# Patient Record
Sex: Male | Born: 1945
Health system: Southern US, Community
[De-identification: ages and names within clinical notes are randomized; demographics above are authoritative.]

## PROBLEM LIST (undated history)

## (undated) DIAGNOSIS — D649 Anemia, unspecified: Secondary | ICD-10-CM

## (undated) DIAGNOSIS — C801 Malignant (primary) neoplasm, unspecified: Secondary | ICD-10-CM

## (undated) DIAGNOSIS — E7800 Pure hypercholesterolemia, unspecified: Secondary | ICD-10-CM

## (undated) HISTORY — PX: OTHER SURGICAL HISTORY: SHX169

## (undated) HISTORY — PX: EYE SURGERY: SHX253

## (undated) HISTORY — PX: HERNIA REPAIR: SHX51

## (undated) HISTORY — DX: Anemia, unspecified: D64.9

## (undated) HISTORY — DX: Pure hypercholesterolemia, unspecified: E78.00

---

## 1981-11-30 HISTORY — PX: HERNIA REPAIR: SHX51

## 2012-02-08 LAB — LIPID PANEL
Cholesterol: 175 mg/dL (ref 0–200)
HDL: 42 mg/dL (ref 35–70)
Triglycerides: 111 mg/dL (ref 40–160)

## 2012-02-08 LAB — BASIC METABOLIC PANEL
Potassium: 4.5 mmol/L (ref 3.4–5.3)
Sodium: 142 mmol/L (ref 137–147)

## 2012-02-08 LAB — CBC AND DIFFERENTIAL: Platelets: 189 10*3/uL (ref 150–399)

## 2012-02-08 LAB — HEPATIC FUNCTION PANEL: ALT: 18 U/L (ref 10–40)

## 2012-10-10 ENCOUNTER — Ambulatory Visit: Payer: Self-pay | Admitting: Internal Medicine

## 2013-02-10 ENCOUNTER — Encounter: Payer: Self-pay | Admitting: *Deleted

## 2013-02-17 ENCOUNTER — Telehealth: Payer: Self-pay | Admitting: Internal Medicine

## 2013-02-17 NOTE — Telephone Encounter (Signed)
Yes, I can do a physical at his first appt if it has been more than one year since last.

## 2013-02-17 NOTE — Telephone Encounter (Signed)
Tried call pt voice mail not sent up yet

## 2013-02-17 NOTE — Telephone Encounter (Signed)
Added cpx to visit note

## 2013-02-17 NOTE — Telephone Encounter (Signed)
Left message for pt to call office

## 2013-02-17 NOTE — Telephone Encounter (Signed)
PT SPOUSE CALLED WANTING TO KNOW IF HIS FIRST APPOINTMENT WOULD BE A CPX  THIS IS THE FIRST TIME HE HAS SEEN DR Lorin Picket AT THIS OFFICE

## 2013-02-17 NOTE — Telephone Encounter (Signed)
Patient's wife stated that it has been over a year his last physical was 3.11.13.

## 2013-02-21 ENCOUNTER — Ambulatory Visit (INDEPENDENT_AMBULATORY_CARE_PROVIDER_SITE_OTHER): Payer: Medicare Other | Admitting: Internal Medicine

## 2013-02-21 ENCOUNTER — Encounter: Payer: Self-pay | Admitting: Internal Medicine

## 2013-02-21 VITALS — BP 110/74 | HR 71 | Temp 97.7°F | Ht 69.5 in | Wt 167.0 lb

## 2013-02-21 DIAGNOSIS — Z862 Personal history of diseases of the blood and blood-forming organs and certain disorders involving the immune mechanism: Secondary | ICD-10-CM | POA: Insufficient documentation

## 2013-02-21 DIAGNOSIS — E7800 Pure hypercholesterolemia, unspecified: Secondary | ICD-10-CM

## 2013-02-21 DIAGNOSIS — Z1211 Encounter for screening for malignant neoplasm of colon: Secondary | ICD-10-CM

## 2013-02-21 DIAGNOSIS — Z125 Encounter for screening for malignant neoplasm of prostate: Secondary | ICD-10-CM

## 2013-02-21 DIAGNOSIS — D649 Anemia, unspecified: Secondary | ICD-10-CM

## 2013-02-21 LAB — COMPREHENSIVE METABOLIC PANEL
ALT: 24 U/L (ref 0–53)
AST: 23 U/L (ref 0–37)
BUN: 18 mg/dL (ref 6–23)
Calcium: 9.2 mg/dL (ref 8.4–10.5)
Creatinine, Ser: 1.1 mg/dL (ref 0.4–1.5)
Total Bilirubin: 0.9 mg/dL (ref 0.3–1.2)

## 2013-02-21 LAB — CBC WITH DIFFERENTIAL/PLATELET
Basophils Absolute: 0 10*3/uL (ref 0.0–0.1)
Basophils Relative: 0.8 % (ref 0.0–3.0)
Eosinophils Absolute: 0.3 10*3/uL (ref 0.0–0.7)
HCT: 41.2 % (ref 39.0–52.0)
Hemoglobin: 14.1 g/dL (ref 13.0–17.0)
Lymphs Abs: 1.2 10*3/uL (ref 0.7–4.0)
MCHC: 34.2 g/dL (ref 30.0–36.0)
MCV: 93.4 fl (ref 78.0–100.0)
Monocytes Absolute: 0.5 10*3/uL (ref 0.1–1.0)
Neutro Abs: 2.9 10*3/uL (ref 1.4–7.7)
RBC: 4.41 Mil/uL (ref 4.22–5.81)
RDW: 13 % (ref 11.5–14.6)

## 2013-02-21 LAB — LIPID PANEL
Cholesterol: 188 mg/dL (ref 0–200)
HDL: 39.4 mg/dL (ref >39.00)
LDL Cholesterol: 119 mg/dL — ABNORMAL HIGH (ref 0–99)
Total CHOL/HDL Ratio: 5
Triglycerides: 150 mg/dL — ABNORMAL HIGH (ref 0.0–149.0)

## 2013-02-21 NOTE — Progress Notes (Signed)
  Subjective:    [name], male    DOB: 1946-11-03, 67 y.o.   MRN: [id number]  HPI 67 year old male with past history of anemia and hypercholesterolemia who comes in today to follow up on these issues as well as for a complete physical exam.  He states he is doing well.  Stays active.  Still exercising some, but has not been exercising as much lately.  Plans to do more.  No acid reflux.  No cardiac symptoms with increased activity or exertion.  No acid reflux.  Bowels stable.  Overall feels good.    Past Medical History  Diagnosis Date  . Anemia   . Hypercholesterolemia     Review of Systems Patient denies any headache, lightheadedness or dizziness.  No allergy or sinus symptoms.  No chest pain, tightness or palpitations.  No increased shortness of breath, cough or congestion.  No nausea or vomiting.  No acid reflux.  No abdominal pain or cramping.  No bowel change, such as diarrhea, constipation, BRBPR or melana.  No urine change.        Objective:   Physical Exam Filed Vitals:   02/21/13 0805  BP: 110/74  Pulse: 71  Temp: 97.7 F (58.93 C)   67 year old male in no acute distress.  HEENT:  Nares - clear.  Oropharynx - without lesions. NECK:  Supple.  Nontender.  No audible carotid bruit.  HEART:  Appears to be regular.   LUNGS:  No crackles or wheezing audible.  Respirations even and unlabored.   RADIAL PULSE:  Equal bilaterally.  ABDOMEN:  Soft.  Nontender.  Bowel sounds present and normal.  No audible abdominal bruit.  GU:  Normal descended testicles.  No palpable testicular nodules.   RECTAL:  Could not appreciate any palpable prostate nodules.  Heme negative.   EXTREMITIES:  No increased edema present.  DP pulses palpable and equal bilaterally.          Assessment & Plan:  CARDIOVASCULAR.  Asymptomatic.  Continue risk factor modification.    HEALTH MAINTENANCE.  Physical today.  Check psa.  Declines colonoscopy.  Agreed to IFOB.  Discussed importance  of early detection.  Will notify me when agreeable.

## 2013-02-23 ENCOUNTER — Encounter: Payer: Self-pay | Admitting: *Deleted

## 2013-02-27 ENCOUNTER — Encounter: Payer: Self-pay | Admitting: Internal Medicine

## 2013-02-27 NOTE — Assessment & Plan Note (Signed)
Check cbc.  Off iron supplements.    

## 2013-02-27 NOTE — Assessment & Plan Note (Signed)
Low cholesterol diet and exercise.  Check lipid panel.   

## 2013-03-02 ENCOUNTER — Telehealth: Payer: Self-pay | Admitting: Internal Medicine

## 2013-03-02 NOTE — Telephone Encounter (Signed)
Dropped off envelope for Dr. Lorin Picket @ 10:55 a.m.  Put in file box up front.

## 2013-03-02 NOTE — Telephone Encounter (Signed)
noted 

## 2013-03-03 ENCOUNTER — Other Ambulatory Visit (INDEPENDENT_AMBULATORY_CARE_PROVIDER_SITE_OTHER): Payer: Medicare Other

## 2013-03-03 DIAGNOSIS — Z1211 Encounter for screening for malignant neoplasm of colon: Secondary | ICD-10-CM

## 2013-03-07 ENCOUNTER — Encounter: Payer: Self-pay | Admitting: Internal Medicine

## 2013-05-01 ENCOUNTER — Telehealth: Payer: Self-pay | Admitting: Internal Medicine

## 2013-05-01 NOTE — Telephone Encounter (Signed)
Pt dropped off release for activity form to be filled out.  In box

## 2014-02-28 HISTORY — PX: SKIN CANCER EXCISION: SHX779

## 2014-04-04 ENCOUNTER — Ambulatory Visit (INDEPENDENT_AMBULATORY_CARE_PROVIDER_SITE_OTHER): Payer: Medicare Other | Admitting: Internal Medicine

## 2014-04-04 ENCOUNTER — Encounter: Payer: Self-pay | Admitting: Internal Medicine

## 2014-04-04 VITALS — BP 110/70 | HR 75 | Temp 97.8°F | Ht 70.0 in | Wt 172.5 lb

## 2014-04-04 DIAGNOSIS — D649 Anemia, unspecified: Secondary | ICD-10-CM

## 2014-04-04 DIAGNOSIS — Z125 Encounter for screening for malignant neoplasm of prostate: Secondary | ICD-10-CM

## 2014-04-04 DIAGNOSIS — C4491 Basal cell carcinoma of skin, unspecified: Secondary | ICD-10-CM

## 2014-04-04 DIAGNOSIS — Z1211 Encounter for screening for malignant neoplasm of colon: Secondary | ICD-10-CM

## 2014-04-04 DIAGNOSIS — E7800 Pure hypercholesterolemia, unspecified: Secondary | ICD-10-CM

## 2014-04-04 LAB — COMPREHENSIVE METABOLIC PANEL
ALBUMIN: 4.4 g/dL (ref 3.5–5.2)
ALK PHOS: 35 U/L — AB (ref 39–117)
ALT: 30 U/L (ref 0–53)
AST: 26 U/L (ref 0–37)
BUN: 19 mg/dL (ref 6–23)
CO2: 28 mEq/L (ref 19–32)
Calcium: 9.1 mg/dL (ref 8.4–10.5)
Chloride: 103 mEq/L (ref 96–112)
Creatinine, Ser: 1.2 mg/dL (ref 0.4–1.5)
GFR: 63.75 mL/min (ref >60.00)
Glucose, Bld: 86 mg/dL (ref 70–99)
POTASSIUM: 4.6 meq/L (ref 3.5–5.1)
Sodium: 139 mEq/L (ref 135–145)
Total Bilirubin: 0.8 mg/dL (ref 0.2–1.2)
Total Protein: 6.8 g/dL (ref 6.0–8.3)

## 2014-04-04 LAB — CBC WITH DIFFERENTIAL/PLATELET
BASOS PCT: 0.7 % (ref 0.0–3.0)
Basophils Absolute: 0 10*3/uL (ref 0.0–0.1)
EOS ABS: 0.2 10*3/uL (ref 0.0–0.7)
Eosinophils Relative: 3.4 % (ref 0.0–5.0)
HCT: 42 % (ref 39.0–52.0)
Hemoglobin: 14.4 g/dL (ref 13.0–17.0)
Lymphocytes Relative: 28.5 % (ref 12.0–46.0)
Lymphs Abs: 1.3 10*3/uL (ref 0.7–4.0)
MCHC: 34.2 g/dL (ref 30.0–36.0)
MCV: 94.2 fl (ref 78.0–100.0)
MONO ABS: 0.5 10*3/uL (ref 0.1–1.0)
Monocytes Relative: 10.9 % (ref 3.0–12.0)
NEUTROS PCT: 56.5 % (ref 43.0–77.0)
Neutro Abs: 2.6 10*3/uL (ref 1.4–7.7)
PLATELETS: 190 10*3/uL (ref 150.0–400.0)
RBC: 4.46 Mil/uL (ref 4.22–5.81)
RDW: 13.5 % (ref 11.5–15.5)
WBC: 4.7 10*3/uL (ref 4.0–10.5)

## 2014-04-04 LAB — LIPID PANEL
CHOLESTEROL: 184 mg/dL (ref 0–200)
HDL: 38.3 mg/dL — AB (ref >39.00)
LDL CALC: 108 mg/dL — AB (ref 0–99)
TRIGLYCERIDES: 188 mg/dL — AB (ref 0.0–149.0)
Total CHOL/HDL Ratio: 5
VLDL: 37.6 mg/dL (ref 0.0–40.0)

## 2014-04-04 LAB — PSA, MEDICARE: PSA: 1.11 ng/ml (ref 0.10–4.00)

## 2014-04-04 LAB — TSH: TSH: 3.26 u[IU]/mL (ref 0.35–4.50)

## 2014-04-04 NOTE — Progress Notes (Signed)
  Subjective:    [name], male    DOB: [birth year], 68 y.o.   MRN: [id number]  HPI 68 year old male with past history of anemia and hypercholesterolemia who comes in today to follow up on these issues as well as for a complete physical exam.  He states he is doing well.  Stays active.  Still exercising some, but has not been exercising as much lately.  Plans to do more.  No acid reflux.  No cardiac symptoms with increased activity or exertion.  No acid reflux.  Bowels stable.  Overall feels good.  Had basal cell removed right shoulder.  Performed by Dr Evorn Gong.  Has cataracts.  Followed by Dr Gloriann Loan.     Past Medical History  Diagnosis Date  . Anemia   . Hypercholesterolemia     Review of Systems Patient denies any headache, lightheadedness or dizziness.  No allergy or sinus symptoms.  No chest pain, tightness or palpitations.  No increased shortness of breath, cough or congestion.  No nausea or vomiting.  No acid reflux.  No abdominal pain or cramping.  No bowel change, such as diarrhea, constipation, BRBPR or melana.  No urine change.        Objective:   Physical Exam  Filed Vitals:   04/04/14 0846  BP: 110/70  Pulse: 75  Temp: 97.8 F (54.26 C)   68 year old male in no acute distress.  HEENT:  Nares - clear.  Oropharynx - without lesions. NECK:  Supple.  Nontender.  No audible carotid bruit.  HEART:  Appears to be regular.   LUNGS:  No crackles or wheezing audible.  Respirations even and unlabored.   RADIAL PULSE:  Equal bilaterally.  ABDOMEN:  Soft.  Nontender.  Bowel sounds present and normal.  No audible abdominal bruit.  GU:  Normal descended testicles.  No palpable testicular nodules.   RECTAL:  Could not appreciate any palpable prostate nodules.  Heme negative.   EXTREMITIES:  No increased edema present.  DP pulses palpable and equal bilaterally.          Assessment & Plan:  CARDIOVASCULAR.  Asymptomatic.  Continue risk factor modification.     HEALTH MAINTENANCE.  Physical today.  Check psa.  Declines colonoscopy.  Agreed to IFOB.  Again discussed importance of early detection.  Will notify me when agreeable.   I spent 25 minutes with the patient and more than 500% of the time was spent in consultation regarding the above.

## 2014-04-04 NOTE — Progress Notes (Signed)
Pre visit review using our clinic review tool, if applicable. No additional management support is needed unless otherwise documented below in the visit note. 

## 2014-04-06 ENCOUNTER — Encounter: Payer: Self-pay | Admitting: Internal Medicine

## 2014-04-08 ENCOUNTER — Encounter: Payer: Self-pay | Admitting: Internal Medicine

## 2014-04-08 DIAGNOSIS — C4491 Basal cell carcinoma of skin, unspecified: Secondary | ICD-10-CM | POA: Insufficient documentation

## 2014-04-08 NOTE — Assessment & Plan Note (Signed)
Followed by Dr Dasher.   

## 2014-04-08 NOTE — Assessment & Plan Note (Signed)
Low cholesterol diet and exercise.  Check lipid panel.   

## 2014-04-08 NOTE — Assessment & Plan Note (Signed)
Check cbc.  Off iron supplements.

## 2014-04-11 ENCOUNTER — Telehealth: Payer: Self-pay | Admitting: Internal Medicine

## 2014-04-11 ENCOUNTER — Other Ambulatory Visit (INDEPENDENT_AMBULATORY_CARE_PROVIDER_SITE_OTHER): Payer: Medicare Other

## 2014-04-11 DIAGNOSIS — Z1211 Encounter for screening for malignant neoplasm of colon: Secondary | ICD-10-CM

## 2014-04-11 LAB — FECAL OCCULT BLOOD, IMMUNOCHEMICAL: FECAL OCCULT BLD: NEGATIVE

## 2014-04-11 NOTE — Telephone Encounter (Signed)
Lab results in your folder (immunizations updated & original card mailed back to patient)

## 2014-04-11 NOTE — Telephone Encounter (Signed)
Pt came in to office with lab results from [facility] and immunization record.  Placed in Dr. Bary Leriche box.

## 2014-04-11 NOTE — Telephone Encounter (Signed)
Noted.  To be scanned.  (immune to MMR and varicella.

## 2014-04-12 ENCOUNTER — Encounter: Payer: Self-pay | Admitting: Internal Medicine

## 2014-08-27 ENCOUNTER — Encounter: Payer: Self-pay | Admitting: Internal Medicine

## 2014-09-14 ENCOUNTER — Other Ambulatory Visit: Payer: Self-pay

## 2014-10-21 ENCOUNTER — Emergency Department: Payer: Self-pay | Admitting: Emergency Medicine

## 2014-10-21 LAB — CBC
HCT: 42.2 % (ref 40.0–52.0)
HGB: 14 g/dL (ref 13.0–18.0)
MCH: 31.9 pg (ref 26.0–34.0)
MCHC: 33.2 g/dL (ref 32.0–36.0)
MCV: 96 fL (ref 80–100)
PLATELETS: 183 10*3/uL (ref 150–440)
RBC: 4.38 10*6/uL — AB (ref 4.40–5.90)
RDW: 13.2 % (ref 11.5–14.5)
WBC: 6.4 10*3/uL (ref 3.8–10.6)

## 2014-10-21 LAB — COMPREHENSIVE METABOLIC PANEL
Albumin: 3.9 g/dL (ref 3.4–5.0)
Alkaline Phosphatase: 44 U/L — ABNORMAL LOW
Anion Gap: 5 — ABNORMAL LOW (ref 7–16)
BUN: 15 mg/dL (ref 7–18)
Bilirubin,Total: 0.5 mg/dL (ref 0.2–1.0)
CALCIUM: 8.4 mg/dL — AB (ref 8.5–10.1)
CO2: 30 mmol/L (ref 21–32)
Chloride: 108 mmol/L — ABNORMAL HIGH (ref 98–107)
Creatinine: 1.05 mg/dL (ref 0.60–1.30)
EGFR (Non-African Amer.): >60
Glucose: 104 mg/dL — ABNORMAL HIGH (ref 65–99)
OSMOLALITY: 286 (ref 275–301)
Potassium: 4.4 mmol/L (ref 3.5–5.1)
SGOT(AST): 34 U/L (ref 15–37)
SGPT (ALT): 27 U/L
Sodium: 143 mmol/L (ref 136–145)
TOTAL PROTEIN: 7.1 g/dL (ref 6.4–8.2)

## 2014-10-21 LAB — TROPONIN I

## 2014-10-21 LAB — LIPASE, BLOOD: LIPASE: 129 U/L (ref 73–393)

## 2014-10-21 LAB — MAGNESIUM: Magnesium: 2.2 mg/dL

## 2014-10-22 ENCOUNTER — Encounter: Payer: Self-pay | Admitting: Internal Medicine

## 2014-10-22 ENCOUNTER — Ambulatory Visit (INDEPENDENT_AMBULATORY_CARE_PROVIDER_SITE_OTHER): Payer: Medicare Other | Admitting: Cardiovascular Disease

## 2014-10-22 ENCOUNTER — Encounter: Payer: Self-pay | Admitting: Cardiovascular Disease

## 2014-10-22 VITALS — BP 112/70 | HR 74 | Ht 70.0 in | Wt 171.8 lb

## 2014-10-22 DIAGNOSIS — R0789 Other chest pain: Secondary | ICD-10-CM

## 2014-10-22 DIAGNOSIS — I493 Ventricular premature depolarization: Secondary | ICD-10-CM

## 2014-10-22 NOTE — Patient Instructions (Signed)
Your physician has requested that you have a stress echocardiogram. For further information please visit [email or web address]. Please follow instruction sheet as given. - you can eat a light meal  - wear comfortable cloths and walking shoes  - no perfume or lotion on the skin   Your physician has recommended that you wear a holter monitor. Holter monitors are medical devices that record the heart's electrical activity. Doctors most often use these monitors to diagnose arrhythmias. Arrhythmias are problems with the speed or rhythm of the heartbeat. The monitor is a small, portable device. You can wear one while you do your normal daily activities. This is usually used to diagnose what is causing palpitations/syncope (passing out).   Your physician recommends that you schedule a follow-up appointment in:  As needed

## 2014-10-23 ENCOUNTER — Encounter: Payer: Self-pay | Admitting: Cardiovascular Disease

## 2014-10-23 DIAGNOSIS — R0789 Other chest pain: Secondary | ICD-10-CM | POA: Insufficient documentation

## 2014-10-23 DIAGNOSIS — I493 Ventricular premature depolarization: Secondary | ICD-10-CM | POA: Insufficient documentation

## 2014-10-23 NOTE — Progress Notes (Signed)
Primary care physician: Dr. Einar Pheasant  HPI  This is a 68 year old male who was referred from the emergency room at [facility] for evaluation of chest pain. He has no previous cardiac history no significant chronic medical conditions. He does not take any medications. He is not a smoker and has no family history of coronary artery disease. He has been having intermittent episodes of chest discomfort described as substernal pressure feeling when he is lying down to sleep. This usually does not happen with activities even with intense exercise. He feels some skipping in his heart. There has been no dizziness, syncope or presyncope. He denies any shortness of breath. He has no heartburn. These episodes became more frequent and he went to the emergency room at [facility]. His labs were unremarkable. Chest x-ray showed no acute abnormalities. EKG showed PVCs but otherwise no ischemic changes.  No Known Allergies   No current outpatient prescriptions on file prior to visit.   No current facility-administered medications on file prior to visit.     Past Medical History  Diagnosis Date  . Anemia   . Hypercholesterolemia      Past Surgical History  Procedure Laterality Date  . Hernia repair  1983  . Skin cancer excision  4/15    BCCA-shoulder     Family History  Problem Relation Age of Onset  . Diabetes Mother   . Prostate cancer Neg Hx   . Colon cancer Neg Hx      History   Social History  . Marital Status: Married    Spouse Name: N/A    Number of Children: 2  . Years of Education: N/A   Occupational History  . Not on file.   Social History Main Topics  . Smoking status: Never Smoker   . Smokeless tobacco: Never Used  . Alcohol Use: Yes     Comment: occ  . Drug Use: No  . Sexual Activity: Yes   Other Topics Concern  . Not on file   Social History Narrative     ROS A 10 point review of system was performed. It is negative other than that mentioned in the history of  present illness.   PHYSICAL EXAM   BP 112/70 mmHg  Pulse 74  Ht 5\' 10"  (1.778 m)  Wt 171 lb 12 oz (77.905 kg)  BMI 24.64 kg/m2 Constitutional: He is oriented to person, place, and time. He appears well-developed and well-nourished. No distress.  HENT: No nasal discharge.  Head: Normocephalic and atraumatic.  Eyes: Pupils are equal and round.  No discharge. Neck: Normal range of motion. Neck supple. No JVD present. No thyromegaly present.  Cardiovascular: Normal rate, regular rhythm, normal heart sounds. Exam reveals no gallop and no friction rub. No murmur heard.  Pulmonary/Chest: Effort normal and breath sounds normal. No stridor. No respiratory distress. He has no wheezes. He has no rales. He exhibits no tenderness.  Abdominal: Soft. Bowel sounds are normal. He exhibits no distension. There is no tenderness. There is no rebound and no guarding.  Musculoskeletal: Normal range of motion. He exhibits no edema and no tenderness.  Neurological: He is alert and oriented to person, place, and time. Coordination normal.  Skin: Skin is warm and dry. No rash noted. He is not diaphoretic. No erythema. No pallor.  Psychiatric: He has a normal mood and affect. His behavior is normal. Judgment and thought content normal.       EKG: Normal sinus rhythm with frequent PVCs   ASSESSMENT  AND PLAN

## 2014-10-23 NOTE — Assessment & Plan Note (Signed)
The chest pain is overall atypical and nonexertional. It is possible this might be related to PVCs. I requested a stress echocardiogram to rule out a structural or ischemic etiology.

## 2014-10-23 NOTE — Telephone Encounter (Signed)
FYI from the patient

## 2014-10-23 NOTE — Assessment & Plan Note (Signed)
He does not consume excessive amounts of caffeine. Thyroid function was normal in May. The rest of his labs were unremarkable. This could be responsible for some of his symptoms. I requested a Holter monitor to quantify the amount of PVCs in 24 hours and see if treatment is needed.

## 2014-10-29 ENCOUNTER — Telehealth: Payer: Self-pay | Admitting: Cardiovascular Disease

## 2014-10-29 NOTE — Telephone Encounter (Signed)
Pt wife is calling about patient, he was told he would receive a monitor or a call from [facility], they are just checking on it. Please call patient.

## 2014-10-29 NOTE — Telephone Encounter (Signed)
Spoke with Troy Arnold and she mentioned that patient will be contacted this week for placement.

## 2014-11-05 NOTE — Telephone Encounter (Signed)
Pt wife calling stating she has yet to receive a call from [facility] or Margaretha Sheffield. Please call patient.

## 2014-11-06 NOTE — Telephone Encounter (Signed)
Marina-  Can you touch base with Margare[facility] Sheffield again?

## 2014-11-06 NOTE — Telephone Encounter (Signed)
Troy Arnold and she mentioned they were having fax/printer problems resulting in new machine. They have not received order she is aware that order had been faxed twice but due to issues with fax machine they did not receive. I have faxed order to [facility] pt will be contacted next week for placement due to them moving to new building. I will contact pt to make him aware to see if he would like to go there or have monitor placed here.

## 2014-11-06 NOTE — Telephone Encounter (Signed)
LMOVM to contact office if pt would not like to have monitor placed at [facility] and have placed here we will place but if not he will be contacted by [facility].

## 2014-11-14 ENCOUNTER — Ambulatory Visit (INDEPENDENT_AMBULATORY_CARE_PROVIDER_SITE_OTHER): Payer: Medicare Other | Admitting: *Deleted

## 2014-11-14 DIAGNOSIS — I493 Ventricular premature depolarization: Secondary | ICD-10-CM

## 2014-11-26 ENCOUNTER — Other Ambulatory Visit: Payer: Medicare Other

## 2014-11-27 ENCOUNTER — Other Ambulatory Visit: Payer: Medicare Other

## 2014-11-28 ENCOUNTER — Telehealth: Payer: Self-pay | Admitting: *Deleted

## 2014-11-28 NOTE — Telephone Encounter (Signed)
Informed patient per Dr. Fletcher Anon holter showed  NSR with rare PVCS Not enough to cause problems

## 2014-12-03 ENCOUNTER — Ambulatory Visit (INDEPENDENT_AMBULATORY_CARE_PROVIDER_SITE_OTHER): Payer: Self-pay | Admitting: *Deleted

## 2014-12-03 DIAGNOSIS — I493 Ventricular premature depolarization: Secondary | ICD-10-CM

## 2014-12-10 ENCOUNTER — Telehealth: Payer: Self-pay | Admitting: *Deleted

## 2014-12-10 NOTE — Telephone Encounter (Signed)
Reviewed stress echo instructions with patient  PAtient verbalized understanding

## 2014-12-11 ENCOUNTER — Other Ambulatory Visit (INDEPENDENT_AMBULATORY_CARE_PROVIDER_SITE_OTHER): Payer: Medicare Other

## 2014-12-11 DIAGNOSIS — I493 Ventricular premature depolarization: Secondary | ICD-10-CM

## 2014-12-11 DIAGNOSIS — R0789 Other chest pain: Secondary | ICD-10-CM

## 2014-12-18 ENCOUNTER — Telehealth: Payer: Self-pay | Admitting: Cardiovascular Disease

## 2014-12-18 ENCOUNTER — Encounter: Payer: Self-pay | Admitting: Internal Medicine

## 2014-12-18 NOTE — Telephone Encounter (Signed)
Patient had stress test on 01-12 and would like to know results.  Please call patient.

## 2014-12-18 NOTE — Telephone Encounter (Signed)
See result note.  

## 2015-04-08 ENCOUNTER — Encounter: Payer: Self-pay | Admitting: Internal Medicine

## 2015-04-08 ENCOUNTER — Ambulatory Visit (INDEPENDENT_AMBULATORY_CARE_PROVIDER_SITE_OTHER): Payer: Medicare Other | Admitting: Internal Medicine

## 2015-04-08 VITALS — BP 110/78 | HR 68 | Temp 98.0°F | Ht 70.0 in | Wt 166.4 lb

## 2015-04-08 DIAGNOSIS — Z125 Encounter for screening for malignant neoplasm of prostate: Secondary | ICD-10-CM

## 2015-04-08 DIAGNOSIS — I493 Ventricular premature depolarization: Secondary | ICD-10-CM | POA: Diagnosis not present

## 2015-04-08 DIAGNOSIS — D649 Anemia, unspecified: Secondary | ICD-10-CM | POA: Diagnosis not present

## 2015-04-08 DIAGNOSIS — E7800 Pure hypercholesterolemia, unspecified: Secondary | ICD-10-CM

## 2015-04-08 DIAGNOSIS — Z0000 Encounter for general adult medical examination without abnormal findings: Secondary | ICD-10-CM

## 2015-04-08 DIAGNOSIS — C4491 Basal cell carcinoma of skin, unspecified: Secondary | ICD-10-CM

## 2015-04-08 LAB — CBC WITH DIFFERENTIAL/PLATELET
Basophils Absolute: 0 10*3/uL (ref 0.0–0.1)
Basophils Relative: 0.8 % (ref 0.0–3.0)
EOS PCT: 4.1 % (ref 0.0–5.0)
Eosinophils Absolute: 0.2 10*3/uL (ref 0.0–0.7)
HCT: 42.7 % (ref 39.0–52.0)
Hemoglobin: 14.4 g/dL (ref 13.0–17.0)
Lymphocytes Relative: 25.3 % (ref 12.0–46.0)
Lymphs Abs: 1.1 10*3/uL (ref 0.7–4.0)
MCHC: 33.7 g/dL (ref 30.0–36.0)
MCV: 92.3 fl (ref 78.0–100.0)
MONOS PCT: 9.3 % (ref 3.0–12.0)
Monocytes Absolute: 0.4 10*3/uL (ref 0.1–1.0)
Neutro Abs: 2.7 10*3/uL (ref 1.4–7.7)
Neutrophils Relative %: 60.5 % (ref 43.0–77.0)
PLATELETS: 193 10*3/uL (ref 150.0–400.0)
RBC: 4.62 Mil/uL (ref 4.22–5.81)
RDW: 13.3 % (ref 11.5–15.5)
WBC: 4.4 10*3/uL (ref 4.0–10.5)

## 2015-04-08 LAB — COMPREHENSIVE METABOLIC PANEL
ALT: 16 U/L (ref 0–53)
AST: 16 U/L (ref 0–37)
Albumin: 4.2 g/dL (ref 3.5–5.2)
Alkaline Phosphatase: 37 U/L — ABNORMAL LOW (ref 39–117)
BUN: 15 mg/dL (ref 6–23)
CO2: 32 mEq/L (ref 19–32)
Calcium: 9.4 mg/dL (ref 8.4–10.5)
Chloride: 104 mEq/L (ref 96–112)
Creatinine, Ser: 1.07 mg/dL (ref 0.40–1.50)
GFR: 72.56 mL/min (ref >60.00)
Glucose, Bld: 92 mg/dL (ref 70–99)
POTASSIUM: 5 meq/L (ref 3.5–5.1)
Sodium: 140 mEq/L (ref 135–145)
Total Bilirubin: 0.7 mg/dL (ref 0.2–1.2)
Total Protein: 6.7 g/dL (ref 6.0–8.3)

## 2015-04-08 LAB — LIPID PANEL
CHOLESTEROL: 169 mg/dL (ref 0–200)
HDL: 44.6 mg/dL (ref >39.00)
LDL CALC: 97 mg/dL (ref 0–99)
NonHDL: 124.4
Total CHOL/HDL Ratio: 4
Triglycerides: 136 mg/dL (ref 0.0–149.0)
VLDL: 27.2 mg/dL (ref 0.0–40.0)

## 2015-04-08 LAB — PSA, MEDICARE: PSA: 1.77 ng/mL (ref 0.10–4.00)

## 2015-04-08 LAB — TSH: TSH: 2.37 u[IU]/mL (ref 0.35–4.50)

## 2015-04-08 NOTE — Progress Notes (Signed)
Pre visit review using our clinic review tool, if applicable. No additional management support is needed unless otherwise documented below in the visit note. 

## 2015-04-08 NOTE — Progress Notes (Signed)
[name], male   DOB: October 02, 1946, 69 y.o.   MRN: [id number]   Subjective:    [name] [facility], male    DOB: [birth year], 69 y.o.   MRN: [id number]  HPI  Patient here to follow up on his current medical issues and for his physical exam.  Was evaluated by cardiology 11/2014.  Had negative stress test.  Holter monitor revealed rare PVCs and NSR.  Has had no cardiac symptoms since.  Is very physically active.  No cardiac symptoms with increased activity or exertion.  Breathing stable.  Bowels doing well.  No blood.  Declines colonoscopy.  Discussed cologuard.  Information given.    Past Medical History  Diagnosis Date  . Anemia   . Hypercholesterolemia     Review of Systems  Constitutional: Negative for appetite change and unexpected weight change.  HENT: Negative for congestion and sinus pressure.   Eyes: Negative for pain and visual disturbance.  Respiratory: Negative for cough, chest tightness and shortness of breath.   Cardiovascular: Negative for chest pain, palpitations and leg swelling.  Gastrointestinal: Negative for nausea, abdominal pain, diarrhea and constipation.  Genitourinary: Negative for dysuria and difficulty urinating.  Musculoskeletal: Negative for back pain and joint swelling.  Skin: Negative for color change and rash.  Neurological: Negative for dizziness, light-headedness and headaches.  Hematological: Negative for adenopathy. Does not bruise/bleed easily.  Psychiatric/Behavioral: Negative for dysphoric mood and agitation.       Objective:    Physical Exam  Constitutional: He is oriented to person, place, and time. He appears well-developed and well-nourished. No distress.  HENT:  Head: Normocephalic and atraumatic.  Nose: Nose normal.  Mouth/Throat: Oropharynx is clear and moist. No oropharyngeal exudate.  Eyes: Conjunctivae are normal. Right eye exhibits no discharge. Left eye exhibits no discharge.  Neck: Neck supple. No  thyromegaly present.  Cardiovascular: Normal rate and regular rhythm.   Pulmonary/Chest: Breath sounds normal. No respiratory distress. He has no wheezes.  Abdominal: Soft. Bowel sounds are normal. There is no tenderness.  Genitourinary:  Rectal exam - palpable prostate.  Could not appreciate any palpable prostate nodules.  Heme negative.    Musculoskeletal: He exhibits no edema or tenderness.  Lymphadenopathy:    He has no cervical adenopathy.  Neurological: He is alert and oriented to person, place, and time.  Skin: Skin is warm and dry. No rash noted.  Psychiatric: He has a normal mood and affect. His behavior is normal.    BP 110/78 mmHg  Pulse 68  Temp(Src) 98 F (36.7 C) (Oral)  Ht 5\' 10"  (1.778 m)  Wt 166 lb 6 oz (75.467 kg)  BMI 23.87 kg/m2  SpO2 93% Wt Readings from Last 3 Encounters:  04/08/15 166 lb 6 oz (75.467 kg)  10/22/14 171 lb 12 oz (77.905 kg)  04/04/14 172 lb 8 oz (78.245 kg)     Lab Results  Component Value Date   WBC 4.4 04/08/2015   HGB 14.4 04/08/2015   HCT 42.7 04/08/2015   PLT 193.0 04/08/2015   GLUCOSE 92 04/08/2015   CHOL 169 04/08/2015   TRIG 136.0 04/08/2015   HDL 44.60 04/08/2015   LDLCALC 97 04/08/2015   ALT 16 04/08/2015   AST 16 04/08/2015   NA 140 04/08/2015   K 5.0 04/08/2015   CL 104 04/08/2015   CREATININE 1.07 04/08/2015   BUN 15 04/08/2015   CO2 32 04/08/2015   TSH 2.37 04/08/2015   PSA 1.77 04/08/2015  Assessment & Plan:   Problem List Items Addressed This Visit    Anemia - Primary    Off iron supplements.  Recheck cbc.        Relevant Orders   CBC with Differential/Platelet (Completed)   Basal cell carcinoma    Followed by Dr Evorn Gong.       Health care maintenance    Physical 04/08/15.  Check psa with labs today.  Declines colonoscopy.  Discussed the need for screening today.  Discussed cologuard.  He took the information and will let me know if agrees.        Hypercholesterolemia    Low cholesterol diet  and exercise.  Follow lipid panel.        Relevant Orders   Comprehensive metabolic panel (Completed)   Lipid panel (Completed)   PVC (premature ventricular contraction)    Had holter recently that revealed rare PVC's.  Currently stays active with no symptoms.  Follow.       Relevant Orders   TSH (Completed)    Other Visit Diagnoses    Prostate cancer screening        Relevant Orders    PSA, Medicare (Completed)        Einar Pheasant, MD

## 2015-04-09 ENCOUNTER — Encounter: Payer: Self-pay | Admitting: Internal Medicine

## 2015-04-14 ENCOUNTER — Encounter: Payer: Self-pay | Admitting: Internal Medicine

## 2015-04-14 DIAGNOSIS — Z0000 Encounter for general adult medical examination without abnormal findings: Secondary | ICD-10-CM | POA: Insufficient documentation

## 2015-04-14 NOTE — Assessment & Plan Note (Signed)
Off iron supplements.  Recheck cbc.

## 2015-04-14 NOTE — Assessment & Plan Note (Signed)
Low cholesterol diet and exercise.  Follow lipid panel.   

## 2015-04-14 NOTE — Assessment & Plan Note (Signed)
Had holter recently that revealed rare PVC's.  Currently stays active with no symptoms.  Follow.

## 2015-04-14 NOTE — Assessment & Plan Note (Signed)
Physical 04/08/15.  Check psa with labs today.  Declines colonoscopy.  Discussed the need for screening today.  Discussed cologuard.  He took the information and will let me know if agrees.

## 2015-04-14 NOTE — Assessment & Plan Note (Signed)
Followed by Dr Dasher.   

## 2015-05-24 ENCOUNTER — Encounter: Payer: Self-pay | Admitting: Medical Oncology

## 2015-05-24 ENCOUNTER — Emergency Department: Payer: Medicare Other

## 2015-05-24 ENCOUNTER — Emergency Department
Admission: EM | Admit: 2015-05-24 | Discharge: 2015-05-24 | Disposition: A | Discharge status: Home / Self Care | Payer: Medicare Other | Attending: Emergency Medicine | Admitting: Emergency Medicine

## 2015-05-24 DIAGNOSIS — K4091 Unilateral inguinal hernia, without obstruction or gangrene, recurrent: Secondary | ICD-10-CM | POA: Diagnosis not present

## 2015-05-24 DIAGNOSIS — R109 Unspecified abdominal pain: Secondary | ICD-10-CM | POA: Diagnosis present

## 2015-05-24 LAB — CBC WITH DIFFERENTIAL/PLATELET
BASOS ABS: 0 10*3/uL (ref 0–0.1)
BASOS PCT: 1 %
Eosinophils Absolute: 0.1 10*3/uL (ref 0–0.7)
Eosinophils Relative: 2 %
HCT: 43.2 % (ref 40.0–52.0)
Hemoglobin: 14.3 g/dL (ref 13.0–18.0)
Lymphocytes Relative: 18 %
Lymphs Abs: 1.3 10*3/uL (ref 1.0–3.6)
MCH: 31.3 pg (ref 26.0–34.0)
MCHC: 33 g/dL (ref 32.0–36.0)
MCV: 94.9 fL (ref 80.0–100.0)
Monocytes Absolute: 0.5 10*3/uL (ref 0.2–1.0)
Monocytes Relative: 6 %
NEUTROS ABS: 5.6 10*3/uL (ref 1.4–6.5)
NEUTROS PCT: 73 %
Platelets: 193 10*3/uL (ref 150–440)
RBC: 4.55 MIL/uL (ref 4.40–5.90)
RDW: 13.2 % (ref 11.5–14.5)
WBC: 7.6 10*3/uL (ref 3.8–10.6)

## 2015-05-24 LAB — COMPREHENSIVE METABOLIC PANEL
ALBUMIN: 4.4 g/dL (ref 3.5–5.0)
ALT: 20 U/L (ref 17–63)
ANION GAP: 10 (ref 5–15)
AST: 24 U/L (ref 15–41)
Alkaline Phosphatase: 40 U/L (ref 38–126)
BUN: 16 mg/dL (ref 6–20)
CALCIUM: 9 mg/dL (ref 8.9–10.3)
CO2: 26 mmol/L (ref 22–32)
CREATININE: 1.08 mg/dL (ref 0.61–1.24)
Chloride: 103 mmol/L (ref 101–111)
GFR calc Af Amer: >60 mL/min (ref >60)
GFR calc non Af Amer: >60 mL/min (ref >60)
Glucose, Bld: 139 mg/dL — ABNORMAL HIGH (ref 65–99)
Potassium: 4.6 mmol/L (ref 3.5–5.1)
Sodium: 139 mmol/L (ref 135–145)
TOTAL PROTEIN: 7.4 g/dL (ref 6.5–8.1)
Total Bilirubin: 0.9 mg/dL (ref 0.3–1.2)

## 2015-05-24 MED ORDER — IOHEXOL 300 MG/ML  SOLN
100.0000 mL | Freq: Once | INTRAMUSCULAR | Status: AC | PRN
  Administered 2015-05-24: 100 mL via INTRAVENOUS

## 2015-05-24 MED ORDER — MORPHINE SULFATE 4 MG/ML IJ SOLN
4.0000 mg | Freq: Once | INTRAMUSCULAR | Status: AC
  Administered 2015-05-24: 4 mg via INTRAVENOUS

## 2015-05-24 MED ORDER — ONDANSETRON HCL 4 MG/2ML IJ SOLN
4.0000 mg | Freq: Once | INTRAMUSCULAR | Status: AC
  Administered 2015-05-24: 4 mg via INTRAVENOUS

## 2015-05-24 MED ORDER — ONDANSETRON HCL 4 MG/2ML IJ SOLN
INTRAMUSCULAR | Status: AC
  Filled 2015-05-24: qty 2

## 2015-05-24 MED ORDER — MORPHINE SULFATE 4 MG/ML IJ SOLN
INTRAMUSCULAR | Status: AC
  Filled 2015-05-24: qty 1

## 2015-05-24 MED ORDER — SODIUM CHLORIDE 0.9 % IV SOLN
Freq: Once | INTRAVENOUS | Status: AC
  Administered 2015-05-24: 13:00:00 via INTRAVENOUS

## 2015-05-24 MED ORDER — OXYCODONE-ACETAMINOPHEN 5-325 MG PO TABS: 1.0000 | ORAL_TABLET | Freq: Four times a day (QID) | ORAL | Status: DC | PRN

## 2015-05-24 MED ORDER — IOHEXOL 240 MG/ML SOLN: 25.0000 mL | Freq: Once | INTRAMUSCULAR | Status: DC | PRN

## 2015-05-24 MED ORDER — ONDANSETRON HCL 4 MG/2ML IJ SOLN
INTRAMUSCULAR | Status: AC
  Administered 2015-05-24: 4 mg via INTRAVENOUS
  Filled 2015-05-24: qty 2

## 2015-05-24 MED ORDER — IOHEXOL 240 MG/ML SOLN
25.0000 mL | Freq: Once | INTRAMUSCULAR | Status: AC | PRN
  Administered 2015-05-24: 50 mL via ORAL

## 2015-05-24 NOTE — ED Notes (Signed)
Patient resting in stretcher. Respirations even and unlabored. No obvious distress. Cardiac monitor in place. No needs/concerns verbalized at this time. Call bell within reach. Encouraged to call with needs. Will continue to monitor. 

## 2015-05-24 NOTE — H&P (Addendum)
CC: R groin bulge with pain  HPI: Troy Arnold is a pleasant 69 yo Arnold with a history of RIH repair approx 30 years ago who presents with 3 hours of right groin pain and bulge.  Began acutely.  Since here pain and bulge have resolved with ice and trendelenberg. CT shows right inguinal hernia containing fat and possibly a richters hernia containing small bowel with possible obstruction.  Has not had this occur in 30 years nor has he noticed a bulge in the past.  + nausea.  No fevers/chills, chest pain, shortness of breath, cough, current abdominal pain, emesis, diarrhea/constipation, dysuria/hematuria.  Active Ambulatory Problems    Diagnosis Date Noted  . Anemia 02/21/2013  . Hypercholesterolemia 02/21/2013  . Basal cell carcinoma 04/08/2014  . Chest pressure 10/23/2014  . PVC (premature ventricular contraction) 10/23/2014  . Health care maintenance 04/14/2015   Resolved Ambulatory Problems    Diagnosis Date Noted  . No Resolved Ambulatory Problems   No Additional Past Medical History   H/o RIH repair approx 30 years ago.  No Known Allergies   History   Social History  . Marital Status: Married    Spouse Name: N/A  . Number of Children: 2  . Years of Education: N/A   Occupational History  . Not on file.   Social History Main Topics  . Smoking status: Never Smoker   . Smokeless tobacco: Never Used  . Alcohol Use: 0.0 oz/week    0 Standard drinks or equivalent per week     Comment: occ  . Drug Use: No  . Sexual Activity: Yes   Other Topics Concern  . Not on file   Social History Narrative   Family History  Problem Relation Age of Onset  . Diabetes Mother   . Prostate cancer Neg Hx   . Colon cancer Neg Hx     Meds: No home meds  ROS: full ROS obtained, pertinent positives and negatives as above  Blood pressure 121/106, pulse 72, temperature 97.4 F (36.3 C), temperature source Oral, resp. rate 16, height 5\' 10"  (1.778 Arnold), weight 73.029 kg (161 lb), SpO2 99  %. GEN: NAD/A&Ox3 FACE: no obvious facial trauma, normal external nose, normal external ears EYES: no scleral icterus, no conjunctivitis HEAD: normocephalic atraumatic CV: RRR, no MRG RESP: moving air well, lungs clear ABD: soft, nontender, nondistended GROIN: impulse with valsalva in right groin, no obvious hernia left groin EXT: moving all ext well, strength 5/5 NEURO: cnII-XII grossly intact, sensation intact all 4 ext  Labs: Reviewed, significant for WBC of 7.6 with 73%N  CT: reviewed, as per HPI  A/P Troy Arnold with recurrent inguinal hernia, now reduced.  No pain.  Have recommended repair due to risk of incarceration.  I have recommended laparoscopic vs robotic laparoscopic repair for recurrence and because I also feel that this is a superior repair than redo open.  I will see Troy Arnold in 1 week to discuss this procedure further.  He knows to call me or come to ER if he has a similar episode.    Heriberto Antigua

## 2015-05-24 NOTE — Discharge Instructions (Signed)

## 2015-05-24 NOTE — ED Provider Notes (Signed)
[facility] Emergency Department Provider Note     Time seen: ----------------------------------------- 12:10 PM on 05/24/2015 -----------------------------------------    I have reviewed the triage vital signs and the nursing notes.   HISTORY  Chief Complaint Abdominal Pain    HPI Troy Arnold is a 69 y.o. male since ER for acute right or quadrant pain. Patient states also some bulging in that area started about 3 hours prior to arrival. Patient had hernia repair about 3 years ago in this area, has not had any pain or problems there since that period. Patient complains of moderate to severe pain earlier, nothing makes it better or worse.He had some nausea and vomiting earlier which has subsided.   Past Medical History  Diagnosis Date  . Anemia   . Hypercholesterolemia     Patient Active Problem List   Diagnosis Date Noted  . Health care maintenance 04/14/2015  . Chest pressure 10/23/2014  . PVC (premature ventricular contraction) 10/23/2014  . Basal cell carcinoma 04/08/2014  . Anemia 02/21/2013  . Hypercholesterolemia 02/21/2013    Past Surgical History  Procedure Laterality Date  . Hernia repair  1983  . Skin cancer excision  4/15    BCCA-shoulder    Allergies Review of patient's allergies indicates no known allergies.  Social History History  Substance Use Topics  . Smoking status: Never Smoker   . Smokeless tobacco: Never Used  . Alcohol Use: 0.0 oz/week    0 Standard drinks or equivalent per week     Comment: occ    Review of Systems Constitutional: Negative for fever. Eyes: Negative for visual changes. ENT: Negative for sore throat. Cardiovascular: Negative for chest pain. Respiratory: Negative for shortness of breath. Gastrointestinal: Positive for abdominal pain and vomiting Genitourinary: Negative for dysuria. Musculoskeletal: Negative for back pain. Skin: Negative for rash. Neurological: Negative for  headaches, focal weakness or numbness.  10-point ROS otherwise negative.  ____________________________________________   PHYSICAL EXAM:  VITAL SIGNS: ED Triage Vitals  Enc Vitals Group     BP 05/24/15 1146 135/77 mmHg     Pulse Rate 05/24/15 1146 66     Resp 05/24/15 1146 17     Temp 05/24/15 1146 97.4 F (36.3 C)     Temp Source 05/24/15 1146 Oral     SpO2 05/24/15 1146 99 %     Weight 05/24/15 1146 161 lb (73.029 kg)     Height 05/24/15 1146 5\' 10"  (1.778 m)     Head Cir --      Peak Flow --      Pain Score 05/24/15 1147 9     Pain Loc --      Pain Edu? --      Excl. in [place]? --     Constitutional: Alert and oriented. Well appearing and in no distress. Eyes: Conjunctivae are normal. PERRL. Normal extraocular movements. ENT   Head: Normocephalic and atraumatic.   Nose: No congestion/rhinnorhea.   Mouth/Throat: Mucous membranes are moist.   Neck: No stridor. Hematological/Lymphatic/Immunilogical: No cervical lymphadenopathy. Cardiovascular: Normal rate, regular rhythm. Normal and symmetric distal pulses are present in all extremities. No murmurs, rubs, or gallops. Respiratory: Normal respiratory effort without tachypnea nor retractions. Breath sounds are clear and equal bilaterally. No wheezes/rales/rhonchi. Gastrointestinal: There is right lower quadrant tenderness, right inguinal bulge is present that is not reducible. Genitourinary: There is no scrotal tenderness, but there is right inguinal tenderness with a fixed mass. Musculoskeletal: Nontender with normal range of motion in all extremities.  No joint effusions.  No lower extremity tenderness nor edema. Neurologic:  Normal speech and language. No gross focal neurologic deficits are appreciated. Speech is normal. No gait instability. Skin:  Skin is warm, dry and intact. No rash noted. Psychiatric: Mood and affect are normal. Speech and behavior are normal. Patient exhibits appropriate insight and  judgment. ____________________________________________  ED COURSE:  Pertinent labs & imaging results that were available during my care of the patient were reviewed by me and considered in my medical decision making (see chart for details). Patient with incarcerated inguinal hernia at this time. We'll attempt to reduce with ice and Trendelenburg. Patient morphine and Zofran for pain, we'll CT is not reduced ____________________________________________    LABS (pertinent positives/negatives)  Labs Reviewed  COMPREHENSIVE METABOLIC PANEL - Abnormal; Notable for the following:    Glucose, Bld 139 (*)    All other components within normal limits  CBC WITH DIFFERENTIAL/PLATELET  URINALYSIS COMPLETEWITH MICROSCOPIC ([facility] ONLY)    RADIOLOGY Images were viewed by me  CT abdomen and pelvis IMPRESSION: There is a small right inguinal hernia which contains fat and possibly mesentery. There is a small amount of edema within this inguinal sac. Adjacent to the right inguinal hernia, there is a short segment of dilated small bowel with adjacent fluid. Findings are concerning for an incarcerated inguinal hernia.  Punctate nodular densities at lung bases are indeterminate. If the patient is at high risk for bronchogenic carcinoma, follow-up chest CT at 1 year is recommended. If the patient is at low risk, no follow-up is needed. This recommendation follows the consensus statement: Guidelines for Management of Small Pulmonary Nodules Detected on CT Scans: A Statement from the [employer] as published in Radiology 2005; 237:395-400.  These results were called by telephone at the time of interpretation on 05/24/2015 at 1:30 pm to Dr. Lenise Arena , who verbally acknowledged these results.  ____________________________________________  FINAL ASSESSMENT AND PLAN  Incarcerated right inguinal hernia  Plan: Patient with acute reherniation of old right inguinal hernia. CT  findings as above, will discuss with general surgery. Patient receive IV narcotics for pain control.  Patient's hernia had reduced with ice and Trendelenburg and gentle pressure, patient seen by Dr. Rexene Edison who will see the patient in office on Friday for reevaluation in to schedule surgery to fix the reherniation.   Earleen Newport, MD   Earleen Newport, MD 05/24/15 [phone]

## 2015-05-24 NOTE — ED Notes (Signed)
Placed ice pack to groin and positioned patient in trendelenburg per MD request. MD aware.

## 2015-05-24 NOTE — ED Notes (Signed)
Pt reports having rt sided lower abd pain and "buldging" that began 3 hrs pta. Reports hx of hernia.

## 2015-05-24 NOTE — ED Notes (Signed)
D/c instructions reviewed w/ pt - pt denies any further questions or concerns at present.  Pt instructed to not use alcohol, drive, or operate heavy machinery while take the prescription pain medications as they could make him drowsy - pt verbalized understanding.

## 2015-05-27 ENCOUNTER — Other Ambulatory Visit: Payer: Self-pay

## 2015-05-31 ENCOUNTER — Encounter: Payer: Self-pay | Admitting: Surgery

## 2015-06-07 ENCOUNTER — Encounter: Payer: Self-pay | Admitting: Internal Medicine

## 2015-06-10 ENCOUNTER — Encounter
Admission: RE | Admit: 2015-06-10 | Discharge: 2015-06-10 | Disposition: A | Discharge status: Home / Self Care | Payer: Medicare Other | Source: Ambulatory Visit | Attending: Surgery | Admitting: Surgery

## 2015-06-10 DIAGNOSIS — Z029 Encounter for administrative examinations, unspecified: Secondary | ICD-10-CM | POA: Insufficient documentation

## 2015-06-10 DIAGNOSIS — Z833 Family history of diabetes mellitus: Secondary | ICD-10-CM | POA: Diagnosis not present

## 2015-06-10 DIAGNOSIS — R0789 Other chest pain: Secondary | ICD-10-CM | POA: Diagnosis not present

## 2015-06-10 DIAGNOSIS — I493 Ventricular premature depolarization: Secondary | ICD-10-CM | POA: Insufficient documentation

## 2015-06-10 DIAGNOSIS — E780 Pure hypercholesterolemia: Secondary | ICD-10-CM | POA: Diagnosis not present

## 2015-06-10 DIAGNOSIS — C4491 Basal cell carcinoma of skin, unspecified: Secondary | ICD-10-CM | POA: Diagnosis not present

## 2015-06-10 DIAGNOSIS — D649 Anemia, unspecified: Secondary | ICD-10-CM | POA: Diagnosis not present

## 2015-06-10 HISTORY — DX: Malignant (primary) neoplasm, unspecified: C80.1

## 2015-06-10 NOTE — Patient Instructions (Signed)
  Your procedure is scheduled on: 06/25/15 Tues Report to Day Surgery. To find out your arrival time please call [phone] between 1PM - 3PM on 06/24/15 Mon.  Remember: Instructions that are not followed completely may result in serious medical risk, up to and including death, or upon the discretion of your surgeon and anesthesiologist your surgery may need to be rescheduled.    _x___ 1. Do not eat food or drink liquids after midnight. No gum chewing or hard candies.     _x___ 2. No Alcohol for 24 hours before or after surgery.   ____ 3. Bring all medications with you on the day of surgery if instructed.    _x___ 4. Notify your doctor if there is any change in your medical condition     (cold, fever, infections).     Do not wear jewelry, make-up, hairpins, clips or nail polish.  Do not wear lotions, powders, or perfumes. You may wear deodorant.  Do not shave 48 hours prior to surgery. Men may shave face and neck.  Do not bring valuables to the hospital.    [facility] is not responsible for any belongings or valuables.               Contacts, dentures or bridgework may not be worn into surgery.  Leave your suitcase in the car. After surgery it may be brought to your room.  For patients admitted to the hospital, discharge time is determined by your                treatment team.   Patients discharged the day of surgery will not be allowed to drive home.   Please read over the following fact sheets that you were given:      ____ Take these medicines the morning of surgery with A SIP OF WATER:    1.   2.   3.   4.  5.  6.  ____ Fleet Enema (as directed)   _x___ Use CHG Soap as directed  ____ Use inhalers on the day of surgery  ____ Stop metformin 2 days prior to surgery    ____ Take 1/2 of usual insulin dose the night before surgery and none on the morning of surgery.   ____ Stop Coumadin/Plavix/aspirin on   ____ Stop Anti-inflammatories on    ____ Stop  supplements until after surgery.    ____ Bring C-Pap to the hospital.

## 2015-06-17 ENCOUNTER — Other Ambulatory Visit: Payer: Medicare Other

## 2015-06-19 ENCOUNTER — Encounter: Payer: Self-pay | Admitting: Internal Medicine

## 2015-06-25 ENCOUNTER — Ambulatory Visit: Payer: Medicare Other | Admitting: Anesthesiology

## 2015-06-25 ENCOUNTER — Encounter: Payer: Self-pay | Admitting: *Deleted

## 2015-06-25 ENCOUNTER — Encounter
Admission: RE | Disposition: A | Discharge status: Home / Self Care | Payer: Self-pay | Source: Ambulatory Visit | Attending: Surgery

## 2015-06-25 ENCOUNTER — Ambulatory Visit
Admission: RE | Admit: 2015-06-25 | Discharge: 2015-06-25 | Disposition: A | Discharge status: Home / Self Care | Payer: Medicare Other | Source: Ambulatory Visit | Attending: Surgery | Admitting: Surgery

## 2015-06-25 DIAGNOSIS — K4091 Unilateral inguinal hernia, without obstruction or gangrene, recurrent: Secondary | ICD-10-CM | POA: Insufficient documentation

## 2015-06-25 HISTORY — PX: INGUINAL HERNIA REPAIR: SHX194

## 2015-06-25 SURGERY — REPAIR, HERNIA, INGUINAL, ADULT
Anesthesia: General | Laterality: Right | Wound class: Clean

## 2015-06-25 MED ORDER — BUPIVACAINE-EPINEPHRINE (PF) 0.5% -1:200000 IJ SOLN
INTRAMUSCULAR | Status: DC | PRN
  Administered 2015-06-25: 17 mL

## 2015-06-25 MED ORDER — PROPOFOL 10 MG/ML IV BOLUS
INTRAVENOUS | Status: DC | PRN
  Administered 2015-06-25: 200 mg via INTRAVENOUS

## 2015-06-25 MED ORDER — DEXAMETHASONE SODIUM PHOSPHATE 4 MG/ML IJ SOLN
INTRAMUSCULAR | Status: DC | PRN
  Administered 2015-06-25: 10 mg via INTRAVENOUS

## 2015-06-25 MED ORDER — MIDAZOLAM HCL 2 MG/2ML IJ SOLN
INTRAMUSCULAR | Status: DC | PRN
  Administered 2015-06-25: 2 mg via INTRAVENOUS

## 2015-06-25 MED ORDER — FENTANYL CITRATE (PF) 100 MCG/2ML IJ SOLN
INTRAMUSCULAR | Status: DC | PRN
  Administered 2015-06-25: 100 ug via INTRAVENOUS

## 2015-06-25 MED ORDER — CEFAZOLIN SODIUM 1-5 GM-% IV SOLN
INTRAVENOUS | Status: AC
  Filled 2015-06-25: qty 50

## 2015-06-25 MED ORDER — ONDANSETRON HCL 4 MG/2ML IJ SOLN
INTRAMUSCULAR | Status: DC | PRN
  Administered 2015-06-25: 4 mg via INTRAVENOUS

## 2015-06-25 MED ORDER — CEFAZOLIN SODIUM 1-5 GM-% IV SOLN: 1.0000 g | Freq: Once | INTRAVENOUS | Status: DC

## 2015-06-25 MED ORDER — LIDOCAINE HCL (CARDIAC) 20 MG/ML IV SOLN
INTRAVENOUS | Status: DC | PRN
  Administered 2015-06-25: 50 mg via INTRAVENOUS

## 2015-06-25 MED ORDER — OXYCODONE-ACETAMINOPHEN 5-325 MG PO TABS
1.0000 | ORAL_TABLET | ORAL | Status: DC | PRN
Start: 2015-06-25 — End: 2015-06-25

## 2015-06-25 MED ORDER — FENTANYL CITRATE (PF) 100 MCG/2ML IJ SOLN: 25.0000 ug | INTRAMUSCULAR | Status: DC | PRN

## 2015-06-25 MED ORDER — FAMOTIDINE 20 MG PO TABS
20.0000 mg | ORAL_TABLET | Freq: Once | ORAL | Status: AC
  Administered 2015-06-25: 20 mg via ORAL

## 2015-06-25 MED ORDER — ONDANSETRON HCL 4 MG/2ML IJ SOLN: 4.0000 mg | Freq: Once | INTRAMUSCULAR | Status: DC | PRN

## 2015-06-25 MED ORDER — PHENYLEPHRINE HCL 10 MG/ML IJ SOLN
INTRAMUSCULAR | Status: DC | PRN
  Administered 2015-06-25: 50 ug via INTRAVENOUS
  Administered 2015-06-25: 150 ug via INTRAVENOUS

## 2015-06-25 MED ORDER — LACTATED RINGERS IV SOLN
INTRAVENOUS | Status: DC
  Administered 2015-06-25 (×2): via INTRAVENOUS

## 2015-06-25 MED ORDER — BUPIVACAINE-EPINEPHRINE (PF) 0.5% -1:200000 IJ SOLN
INTRAMUSCULAR | Status: AC
  Filled 2015-06-25: qty 30

## 2015-06-25 MED ORDER — FAMOTIDINE 20 MG PO TABS
ORAL_TABLET | ORAL | Status: AC
  Administered 2015-06-25: 20 mg via ORAL
  Filled 2015-06-25: qty 1

## 2015-06-25 SURGICAL SUPPLY — 24 items
BLADE SURG 15 STRL LF DISP TIS (BLADE) ×1 IMPLANT
BLADE SURG 15 STRL SS (BLADE) ×2
CANISTER SUCT 1200ML W/VALVE (MISCELLANEOUS) ×2 IMPLANT
CHLORAPREP W/TINT 26ML (MISCELLANEOUS) ×2 IMPLANT
DRAIN PENROSE 5/8X18 LTX STRL (WOUND CARE) ×2 IMPLANT
DRAPE PED LAPAROTOMY (DRAPES) ×2 IMPLANT
GLOVE BIO SURGEON STRL SZ7.5 (GLOVE) ×6 IMPLANT
GOWN STRL REUS W/ TWL LRG LVL3 (GOWN DISPOSABLE) ×3 IMPLANT
GOWN STRL REUS W/TWL LRG LVL3 (GOWN DISPOSABLE) ×6
KIT RM TURNOVER STRD PROC AR (KITS) ×2 IMPLANT
LABEL OR SOLS (LABEL) ×2 IMPLANT
LIQUID BAND (GAUZE/BANDAGES/DRESSINGS) ×2 IMPLANT
MESH SYNTHETIC 4X6 SOFT BARD (Mesh General) ×1 IMPLANT
MESH SYNTHETIC SOFT BARD 4X6 (Mesh General) ×1 IMPLANT
NDL SAFETY 25GX1.5 (NEEDLE) ×2 IMPLANT
NS IRRIG 500ML POUR BTL (IV SOLUTION) ×2 IMPLANT
PACK BASIN MINOR ARMC (MISCELLANEOUS) ×2 IMPLANT
PAD GROUND ADULT SPLIT (MISCELLANEOUS) ×2 IMPLANT
SUT CHROMIC 4 0 RB 1X27 (SUTURE) ×2 IMPLANT
SUT MNCRL AB 4-0 PS2 18 (SUTURE) ×2 IMPLANT
SUT SURGILON 0 30 BLK (SUTURE) ×6 IMPLANT
SUT VIC AB 4-0 SH 27 (SUTURE) ×2
SUT VIC AB 4-0 SH 27XANBCTRL (SUTURE) ×2 IMPLANT
SYRINGE 10CC LL (SYRINGE) ×2 IMPLANT

## 2015-06-25 NOTE — Discharge Instructions (Addendum)
Take Tylenol or Percocet 1 or 2 each 4 hours if needed for pain.  May shower.  Avoid straining and heavy lifting.AMBULATORY SURGERY  DISCHARGE INSTRUCTIONS   1) The drugs that you were given will stay in your system until tomorrow so for the next 24 hours you should not:  A) Drive an automobile B) Make any legal decisions C) Drink any alcoholic beverage   2) You may resume regular meals tomorrow.  Today it is better to start with liquids and gradually work up to solid foods.  You may eat anything you prefer, but it is better to start with liquids, then soup and crackers, and gradually work up to solid foods.   3) Please notify your doctor immediately if you have any unusual bleeding, trouble breathing, redness and pain at the surgery site, drainage, fever, or pain not relieved by medication.    4) Additional Instructions:        Please contact your physician with any problems or Same Day Surgery at [phone], Monday through Friday 6 am to 4 pm, or [place] at [facility] number at [phone].General Anesthesia, Care After Refer to this sheet in the next few weeks. These instructions provide you with information on caring for yourself after your procedure. Your health care provider may also give you more specific instructions. Your treatment has been planned according to current medical practices, but problems sometimes occur. Call your health care provider if you have any problems or questions after your procedure. WHAT TO EXPECT AFTER THE PROCEDURE After the procedure, it is typical to experience:  Sleepiness.  Nausea and vomiting. HOME CARE INSTRUCTIONS  For the first 24 hours after general anesthesia:  Have a responsible person with you.  Do not drive a car. If you are alone, do not take public transportation.  Do not drink alcohol.  Do not take medicine that has not been prescribed by your health care provider.  Do not sign important papers or make  important decisions.  You may resume a normal diet and activities as directed by your health care provider.  Change bandages (dressings) as directed.  If you have questions or problems that seem related to general anesthesia, call the hospital and ask for the anesthetist or anesthesiologist on call. SEEK MEDICAL CARE IF:  You have nausea and vomiting that continue the day after anesthesia.  You develop a rash. SEEK IMMEDIATE MEDICAL CARE IF:   You have difficulty breathing.  You have chest pain.  You have any allergic problems. Document Released: 02/22/2001 Document Revised: 11/21/2013 Document Reviewed: 06/01/2013 [facility] Patient Information 2015 [place], [place]. This information is not intended to replace advice given to you by your health care provider. Make sure you discuss any questions you have with your health care provider.

## 2015-06-25 NOTE — Op Note (Signed)
OPERATIVE REPORT  PREOPERATIVE DIAGNOSIS: Recurrent right inguinal hernia  POSTOPERATIVE DIAGNOSIS: Recurrent right  inguinal hernia  PROCEDURE: Recurrent right inguinal hernia repair  ANESTHESIA:  General  SURGEON:  Rochel Brome M.D.  INDICATIONS: He had a history of right inguinal hernia repair approximately 35 years ago. He has had recent bulging. A right inguinal hernia was demonstrated on physical exam.  With the patient on the operating table in the supine position the right lower quadrant was prepared with clippers and with ChloraPrep and draped in a sterile manner. A transversely oriented suprapubic incision was made and carried down through subcutaneous tissues. Electrocautery was used for hemostasis. One traversing vein was divided between 4-0 chromic suture ligatures. The Scarpa's fascia was incised. In dissecting down to the external oblique aponeurosis scar tissue was noted from previous surgery. The external oblique aponeurosis was incised along the course of its fibers to open the external ring and expose the inguinal cord structures. The cord structures were mobilized. A Penrose drain was passed around the cord structures for traction. There was a direct inguinal hernia with the defect found in the floor of the inguinal canal. The bulge was partially 3 cm in dimension. The attenuated transversalis fascia was incised circumferentially and the transversalis fascia was removed. The hernia sac was inverted. There was also a smaller defect just below the first. Herniated properitoneal fat was dissected free from surrounding structures the small bridge between the 2 defects was divided. The remaining herniated properitoneal fat was reduced. The fascial ring defect was further demonstrated. The fascia was closed with interrupted 0 Surgilon sutures and onlay Bard soft mesh was cut to create an oval mesh of approximately 2 x 3 cm in dimension. The mesh was sutured to the repair and also sutured  at the edges to the deep fascia. The repair looked good and hemostasis was intact. The fascia surrounding the repair was infiltrated with half percent Sensorcaine with epinephrine. Cord structures were replaced along the floor of the inguinal canal. The cut edges of the external oblique aponeurosis were approximated with running 4-0 Vicryl suture. The deep fascia superior and lateral to the repair site was infiltrated with half percent Sensorcaine with epinephrine. Subcutaneous tissues were infiltrated. Scarpa's fascia was closed with interrupted 4-0 Vicryl sutures. The skin was closed with running 4-0 Monocryl subcutaneous suture and LiquiBand. The testicle remained in the scrotum. The patient appeared to tolerate the procedure satisfactorily and was prepared for transfer to the recovery room.  Rochel Brome M.D.

## 2015-06-25 NOTE — Transfer of Care (Signed)
Immediate Anesthesia Transfer of Care Note  Patient: Troy Arnold  Procedure(s) Performed: Procedure(s): HERNIA REPAIR INGUINAL ADULT (Right)  Patient Location: PACU  Anesthesia Type:General  Level of Consciousness: oriented and sedated  Airway & Oxygen Therapy: Patient Spontanous Breathing and Patient connected to nasal cannula oxygen  Post-op Assessment: Report given to RN and Post -op Vital signs reviewed and stable  Post vital signs: Reviewed and stable  Last Vitals:  Filed Vitals:   06/25/15 0935  BP: 123/73  Pulse: 72  Temp: 36.8 C  Resp: 16    Complications: No apparent anesthesia complications

## 2015-06-25 NOTE — Anesthesia Preprocedure Evaluation (Signed)
Anesthesia Evaluation  Patient identified by MRN, date of birth, ID band Patient awake    Reviewed: Allergy & Precautions, NPO status , Patient's Chart, lab work & pertinent test results  History of Anesthesia Complications Negative for: history of anesthetic complications  Airway Mallampati: II       Dental  (+) Teeth Intact   Pulmonary neg pulmonary ROS,    Pulmonary exam normal       Cardiovascular negative cardio ROS Normal cardiovascular exam    Neuro/Psych negative neurological ROS  negative psych ROS   GI/Hepatic negative GI ROS, Neg liver ROS,   Endo/Other  negative endocrine ROS  Renal/GU negative Renal ROS  negative genitourinary   Musculoskeletal negative musculoskeletal ROS (+)   Abdominal Normal abdominal exam  (+)   Peds negative pediatric ROS (+)  Hematology negative hematology ROS (+) anemia ,   Anesthesia Other Findings   Reproductive/Obstetrics negative OB ROS                             Anesthesia Physical Anesthesia Plan  ASA: II  Anesthesia Plan: General   Post-op Pain Management:    Induction: Intravenous  Airway Management Planned: LMA  Additional Equipment:   Intra-op Plan:   Post-operative Plan:   Informed Consent: I have reviewed the patients History and Physical, chart, labs and discussed the procedure including the risks, benefits and alternatives for the proposed anesthesia with the patient or authorized representative who has indicated his/her understanding and acceptance.     Plan Discussed with: CRNA  Anesthesia Plan Comments:         Anesthesia Quick Evaluation

## 2015-06-25 NOTE — Progress Notes (Signed)
Iv d/c'd - cath tip intact - site free from redness/edema - covered with gauze/paper tape

## 2015-06-25 NOTE — H&P (Signed)
  He reports no change in condition since the day of the office visit.  I discussed the plan for surgery and postoperative care

## 2015-06-25 NOTE — Progress Notes (Signed)
Per Beryle Quant via ascom (OR Room 8) per Dr Tamala Julian OK to d/c pt to home without being seen by him in postop.

## 2015-06-25 NOTE — Anesthesia Postprocedure Evaluation (Signed)
  Anesthesia Post-op Note  Patient: Troy Arnold  Procedure(s) Performed: Procedure(s): HERNIA REPAIR INGUINAL ADULT (Right)  Anesthesia type:General  Patient location: PACU  Post pain: Pain level controlled  Post assessment: Post-op Vital signs reviewed, Patient's Cardiovascular Status Stable, Respiratory Function Stable, Patent Airway and No signs of Nausea or vomiting  Post vital signs: Reviewed and stable  Last Vitals:  Filed Vitals:   06/25/15 0935  BP: 123/73  Pulse: 72  Temp: 36.8 C  Resp: 16    Level of consciousness: awake, alert  and patient cooperative  Complications: No apparent anesthesia complications

## 2015-09-05 ENCOUNTER — Ambulatory Visit (INDEPENDENT_AMBULATORY_CARE_PROVIDER_SITE_OTHER): Payer: Medicare Other

## 2015-09-05 VITALS — BP 118/78 | HR 76 | Temp 97.4°F | Resp 12 | Ht 70.0 in | Wt 165.6 lb

## 2015-09-05 DIAGNOSIS — Z23 Encounter for immunization: Secondary | ICD-10-CM | POA: Diagnosis not present

## 2015-09-05 DIAGNOSIS — Z0000 Encounter for general adult medical examination without abnormal findings: Secondary | ICD-10-CM

## 2015-09-05 LAB — HEPATITIS C ANTIBODY: HCV Ab: NEGATIVE

## 2015-09-05 NOTE — Progress Notes (Signed)
Subjective:   Troy Arnold is a 69 y.o. male who presents for an Initial Medicare Annual Wellness Visit.  Review of Systems  No ROS.  Medicare Wellness Visit.  Cardiac Risk Factors include: advanced age (>59men, >62 women);male gender    Objective:    Today's Vitals   09/05/15 0912  BP: 118/78  Pulse: 76  Temp: 97.4 F (36.3 C)  TempSrc: Oral  Resp: 12  Height: 5\' 10"  (1.778 m)  Weight: 165 lb 9.6 oz (75.116 kg)  SpO2: 98%    Current Medications (verified) Outpatient Encounter Prescriptions as of 09/05/2015  Medication Sig  . [DISCONTINUED] oxyCODONE-acetaminophen (ROXICET) 5-325 MG per tablet Take 1 tablet by mouth every 6 (six) hours as needed. (Patient not taking: Reported on 06/25/2015)   No facility-administered encounter medications on file as of 09/05/2015.    Allergies (verified) Review of patient's allergies indicates no known allergies.   History: Past Medical History  Diagnosis Date  . Anemia   . Hypercholesterolemia   . Cancer [facility])     skin   Past Surgical History  Procedure Laterality Date  . Hernia repair  1983  . Skin cancer excision  4/15    BCCA-shoulder  . Hernia repair    . Rod placed rt leg Right   . Plate in rt forearm Right   . Inguinal hernia repair Right 06/25/2015    Procedure: HERNIA REPAIR INGUINAL ADULT;  Surgeon: Leonie Green, MD;  Location: [facility] ORS;  Service: General;  Laterality: Right;   Family History  Problem Relation Age of Onset  . Diabetes Mother   . Prostate cancer Neg Hx   . Colon cancer Neg Hx    Social History   Occupational History  . Not on file.   Social History Main Topics  . Smoking status: Never Smoker   . Smokeless tobacco: Never Used  . Alcohol Use: 0.0 oz/week    0 Standard drinks or equivalent per week     Comment: rare  . Drug Use: No  . Sexual Activity: Yes   Tobacco Counseling Counseling given: Not Answered   Activities of Daily Living In your present state of health,  do you have any difficulty performing the following activities: 09/05/2015 06/10/2015  Hearing? N N  Vision? N N  Difficulty concentrating or making decisions? N N  Walking or climbing stairs? N N  Dressing or bathing? N N  Doing errands, shopping? N -  Preparing Food and eating ? N -  Using the Toilet? N -  In the past six months, have you accidently leaked urine? N -  Do you have problems with loss of bowel control? N -  Managing your Medications? N -  Managing your Finances? N -  Housekeeping or managing your Housekeeping? N -    Immunizations and Health Maintenance Immunization History  Administered Date(s) Administered  . Hepatitis B 04/08/2010, 05/12/2010, 10/08/2010  . Influenza Split 07/30/2014  . Influenza,inj,Quad PF,36+ Mos 09/05/2015  . Influenza-Unspecified 09/08/2011  . Pneumococcal Conjugate-13 09/05/2015  . Td 07/30/2014   There are no preventive care reminders to display for this patient.  Patient Care Team: Einar Pheasant, MD as PCP - General (Internal Medicine)  Indicate any recent Medical Services you may have received from other than [facility] providers in the past year (date may be approximate).    Assessment:   This is a routine wellness examination for Troy Arnold.  The goal of the wellness visit is to assist the patient how  to close the gaps in care and create a preventative care plan for the patient.   Osteoporosis risk reviewed.  Safety issues reviewed; smoke detectors in the home. Firearms locked in a secure area. Wears seatbelts when driving or riding with others. No violence in the home.  No identified risk were noted; The patient was oriented x 3; appropriate in dress and manner and no objective failures at ADL's or IADL's.   Hearing screening- Fail bilateral 4000Hz . Educational information provided. No C/O trouble hearing.  End of life planning was discussed; advance directive on file.  Hearing/Vision screen  Hearing Screening   Method:  Audiometry   125Hz  250Hz  500Hz  1000Hz  2000Hz  4000Hz  8000Hz   Right ear:   Pass Pass Pass Fail   Left ear:   Pass Pass Pass Fail   Vision Screening Comments: Followed by Dr. Gloriann Loan. Wears glasses.    Dietary issues and exercise activities discussed: Current Exercise Habits:: Home exercise routine, Type of exercise: walking;strength training/weights, Time (Minutes): 30, Frequency (Times/Week): 3, Weekly Exercise (Minutes/Week): 90, Intensity: Moderate  Goals    . Eat more fruits and vegetables     Eat more leafy green foods.  Continue water intake with fresh fruits and vegetables.       Depression Screen PHQ 2/9 Scores 09/05/2015 04/08/2015 04/04/2014 02/27/2013  PHQ - 2 Score 0 0 0 0    Fall Risk Fall Risk  09/05/2015 04/08/2015 04/04/2014 02/27/2013  Falls in the past year? No No No No    Cognitive Function: MMSE - Mini Mental State Exam 09/05/2015  Orientation to time 5  Orientation to Place 5  Registration 3  Attention/ Calculation 5  Recall 3  Language- name 2 objects 2  Language- repeat 1  Language- follow 3 step command 3  Language- read & follow direction 1  Write a sentence 1  Copy design 1  Total score 30    Screening Tests Health Maintenance  Topic Date Due  . ZOSTAVAX  04/06/2016 (Originally 12/22/2004)  . COLONOSCOPY  09/04/2016 (Originally 12/22/1994)  . INFLUENZA VACCINE  06/30/2016  . PNA vac Low Risk Adult (2 of 2 - PPSV23) 09/04/2016  . TETANUS/TDAP  07/30/2024  . Hepatitis C Screening  Completed        ZOSTAVAX postponed until CPE.   Plan:   Colonoscopy referral declined.  COLOGUARD order submitted today.   During the course of the visit Troy Arnold was educated and counseled about the following appropriate screening and preventive services:   Vaccines to include Pneumoccal, Influenza, Hepatitis B, Td, Zostavax, HCV  Electrocardiogram  Colorectal cancer screening  Cardiovascular disease screening  Diabetes screening  Glaucoma screening  Nutrition  counseling  Prostate cancer screening  Smoking cessation counseling  Patient Instructions (the written plan) were given to the patient.   Troy Biles, LPN   67/12/945     Note and plan reviewed.  Agree with above plan.    Dr Nicki Reaper

## 2015-09-05 NOTE — Patient Instructions (Addendum)
Troy Arnold,  Thank you for taking time to come for your Medicare Wellness Visit.  I appreciate your ongoing commitment to your health goals. Please review the following plan we discussed and let me know if I can assist you in the future.  Flu shot administered today  Prevnar 13 administered today  Hep C Screening completed today  COLOGUARD forms completed.  To be received via mail.  Colonoscopy A colonoscopy is an exam to look at the entire large intestine (colon). This exam can help find problems such as tumors, polyps, inflammation, and areas of bleeding. The exam takes about 1 hour.  LET [facility] CARE PROVIDER KNOW ABOUT:   Any allergies you have.  All medicines you are taking, including vitamins, herbs, eye drops, creams, and over-the-counter medicines.  Previous problems you or members of your family have had with the use of anesthetics.  Any blood disorders you have.  Previous surgeries you have had.  Medical conditions you have. RISKS AND COMPLICATIONS  Generally, this is a safe procedure. However, as with any procedure, complications can occur. Possible complications include:  Bleeding.  Tearing or rupture of the colon wall.  Reaction to medicines given during the exam.  Infection (rare). BEFORE THE PROCEDURE   Ask your health care provider about changing or stopping your regular medicines.  You may be prescribed an oral bowel prep. This involves drinking a large amount of medicated liquid, starting the day before your procedure. The liquid will cause you to have multiple loose stools until your stool is almost clear or light green. This cleans out your colon in preparation for the procedure.  Do not eat or drink anything else once you have started the bowel prep, unless your health care provider tells you it is safe to do so.  Arrange for someone to drive you home after the procedure. PROCEDURE   You will be given medicine to help you relax  (sedative).  You will lie on your side with your knees bent.  A long, flexible tube with a light and camera on the end (colonoscope) will be inserted through the rectum and into the colon. The camera sends video back to a computer screen as it moves through the colon. The colonoscope also releases carbon dioxide gas to inflate the colon. This helps your health care provider see the area better.  During the exam, your health care provider may take a small tissue sample (biopsy) to be examined under a microscope if any abnormalities are found.  The exam is finished when the entire colon has been viewed. AFTER THE PROCEDURE   Do not drive for 24 hours after the exam.  You may have a small amount of blood in your stool.  You may pass moderate amounts of gas and have mild abdominal cramping or bloating. This is caused by the gas used to inflate your colon during the exam.  Ask when your test results will be ready and how you will get your results. Make sure you get your test results.   This information is not intended to replace advice given to you by your health care provider. Make sure you discuss any questions you have with your health care provider.   Document Released: 11/13/2000 Document Revised: 09/06/2013 Document Reviewed: 07/24/2013 Elsevier Interactive Patient Education 2016 [employer]  Fat and Cholesterol Restricted Diet High levels of fat and cholesterol in your blood may lead to various health problems, such as diseases of the heart, blood vessels, gallbladder, liver,  and pancreas. Fats are concentrated sources of energy that come in various forms. Certain types of fat, including saturated fat, may be harmful in excess. Cholesterol is a substance needed by your body in small amounts. Your body makes all the cholesterol it needs. Excess cholesterol comes from the food you eat. When you have high levels of cholesterol and saturated fat in your blood, health problems can develop  because the excess fat and cholesterol will gather along the walls of your blood vessels, causing them to narrow. Choosing the right foods will help you control your intake of fat and cholesterol. This will help keep the levels of these substances in your blood within normal limits and reduce your risk of disease. WHAT IS MY PLAN? Your health care provider recommends that you:  Get no more than __________ % of the total calories in your daily diet from fat.  Limit your intake of saturated fat to less than ______% of your total calories each day.  Limit the amount of cholesterol in your diet to less than _________mg per day. WHAT TYPES OF FAT SHOULD I CHOOSE?  Choose healthy fats more often. Choose monounsaturated and polyunsaturated fats, such as olive and canola oil, flaxseeds, walnuts, almonds, and seeds.  Eat more omega-3 fats. Good choices include salmon, mackerel, sardines, tuna, flaxseed oil, and ground flaxseeds. Aim to eat fish at least two times a week.  Limit saturated fats. Saturated fats are primarily found in animal products, such as meats, butter, and cream. Plant sources of saturated fats include palm oil, palm kernel oil, and coconut oil.  Avoid foods with partially hydrogenated oils in them. These contain trans fats. Examples of foods that contain trans fats are stick margarine, some tub margarines, cookies, crackers, and other baked goods. WHAT GENERAL GUIDELINES DO I NEED TO FOLLOW? These guidelines for healthy eating will help you control your intake of fat and cholesterol:  Check food labels carefully to identify foods with trans fats or high amounts of saturated fat.  Fill one half of your plate with vegetables and green salads.  Fill one fourth of your plate with whole grains. Look for the word "whole" as the first word in the ingredient list.  Fill one fourth of your plate with lean protein foods.  Limit fruit to two servings a day. Choose fruit instead of  juice.  Eat more foods that contain soluble fiber. Examples of foods that contain this type of fiber are apples, broccoli, carrots, beans, peas, and barley. Aim to get 20-30 g of fiber per day.  Eat more home-cooked food and less restaurant, buffet, and fast food.  Limit or avoid alcohol.  Limit foods high in starch and sugar.  Limit fried foods.  Cook foods using methods other than frying. Baking, boiling, grilling, and broiling are all great options.  Lose weight if you are overweight. Losing just 5-10% of your initial body weight can help your overall health and prevent diseases such as diabetes and heart disease. WHAT FOODS CAN I EAT? Grains Whole grains, such as whole wheat or whole grain breads, crackers, cereals, and pasta. Unsweetened oatmeal, bulgur, barley, quinoa, or brown rice. Corn or whole wheat flour tortillas. Vegetables Fresh or frozen vegetables (raw, steamed, roasted, or grilled). Green salads. Fruits All fresh, canned (in natural juice), or frozen fruits. Meat and Other Protein Products Ground beef (85% or leaner), grass-fed beef, or beef trimmed of fat. Skinless chicken or [place]. Ground chicken or [place]. Pork trimmed of fat. All fish  and seafood. Eggs. Dried beans, peas, or lentils. Unsalted nuts or seeds. Unsalted canned or dry beans. Dairy Low-fat dairy products, such as skim or 1% milk, 2% or reduced-fat cheeses, low-fat ricotta or cottage cheese, or plain low-fat yogurt. Fats and Oils Tub margarines without trans fats. Light or reduced-fat mayonnaise and salad dressings. Avocado. Olive, canola, sesame, or safflower oils. Natural peanut or almond butter (choose ones without added sugar and oil). The items listed above may not be a complete list of recommended foods or beverages. Contact your dietitian for more options. WHAT FOODS ARE NOT RECOMMENDED? Grains White bread. White pasta. White rice. Cornbread. Bagels, pastries, and croissants. Crackers that contain  trans fat. Vegetables White potatoes. Corn. Creamed or fried vegetables. Vegetables in a cheese sauce. Fruits Dried fruits. Canned fruit in light or heavy syrup. Fruit juice. Meat and Other Protein Products Fatty cuts of meat. Ribs, chicken wings, bacon, sausage, bologna, salami, chitterlings, fatback, hot dogs, bratwurst, and packaged luncheon meats. Liver and organ meats. Dairy Whole or 2% milk, cream, half-and-half, and cream cheese. Whole milk cheeses. Whole-fat or sweetened yogurt. Full-fat cheeses. Nondairy creamers and whipped toppings. Processed cheese, cheese spreads, or cheese curds. Sweets and Desserts Corn syrup, sugars, honey, and molasses. Candy. Jam and jelly. Syrup. Sweetened cereals. Cookies, pies, cakes, donuts, muffins, and ice cream. Fats and Oils Butter, stick margarine, lard, shortening, ghee, or bacon fat. Coconut, palm kernel, or palm oils. Beverages Alcohol. Sweetened drinks (such as sodas, lemonade, and fruit drinks or punches). The items listed above may not be a complete list of foods and beverages to avoid. Contact your dietitian for more information.   This information is not intended to replace advice given to you by your health care provider. Make sure you discuss any questions you have with your health care provider.   Document Released: 11/16/2005 Document Revised: 12/07/2014 Document Reviewed: 02/14/2014 Elsevier Interactive Patient Education 2016 [employer] Maintenance, Male A healthy lifestyle and preventative care can promote health and wellness.  Maintain regular health, dental, and eye exams.  Eat a healthy diet. Foods like vegetables, fruits, whole grains, low-fat dairy products, and lean protein foods contain the nutrients you need and are low in calories. Decrease your intake of foods high in solid fats, added sugars, and salt. Get information about a proper diet from your health care provider, if necessary.  Regular physical  exercise is one of the most important things you can do for your health. Most adults should get at least 150 minutes of moderate-intensity exercise (any activity that increases your heart rate and causes you to sweat) each week. In addition, most adults need muscle-strengthening exercises on 2 or more days a week.   Maintain a healthy weight. The body mass index (BMI) is a screening tool to identify possible weight problems. It provides an estimate of body fat based on height and weight. Your health care provider can find your BMI and can help you achieve or maintain a healthy weight. For males 20 years and older:  A BMI below 18.5 is considered underweight.  A BMI of 18.5 to 24.9 is normal.  A BMI of 25 to 29.9 is considered overweight.  A BMI of 30 and above is considered obese.  Maintain normal blood lipids and cholesterol by exercising and minimizing your intake of saturated fat. Eat a balanced diet with plenty of fruits and vegetables. Blood tests for lipids and cholesterol should begin at age 50 and be repeated every 5 years. If  your lipid or cholesterol levels are high, you are over age 29, or you are at high risk for heart disease, you may need your cholesterol levels checked more frequently.Ongoing high lipid and cholesterol levels should be treated with medicines if diet and exercise are not working.  If you smoke, find out from your health care provider how to quit. If you do not use tobacco, do not start.  Lung cancer screening is recommended for adults aged 80-80 years who are at high risk for developing lung cancer because of a history of smoking. A yearly low-dose CT scan of the lungs is recommended for people who have at least a 30-pack-year history of smoking and are current smokers or have quit within the past 15 years. A pack year of smoking is smoking an average of 1 pack of cigarettes a day for 1 year (for example, a 30-pack-year history of smoking could mean smoking 1 pack a  day for 30 years or 2 packs a day for 15 years). Yearly screening should continue until the smoker has stopped smoking for at least 15 years. Yearly screening should be stopped for people who develop a health problem that would prevent them from having lung cancer treatment.  If you choose to drink alcohol, do not have more than 2 drinks per day. One drink is considered to be 12 oz (360 mL) of beer, 5 oz (150 mL) of wine, or 1.5 oz (45 mL) of liquor.  Avoid the use of street drugs. Do not share needles with anyone. Ask for help if you need support or instructions about stopping the use of drugs.  High blood pressure causes heart disease and increases the risk of stroke. High blood pressure is more likely to develop in:  People who have blood pressure in the end of the normal range (100-139/85-89 mm Hg).  People who are overweight or obese.  People who are African American.  If you are 67-36 years of age, have your blood pressure checked every 3-5 years. If you are 7 years of age or older, have your blood pressure checked every year. You should have your blood pressure measured twice--once when you are at a hospital or clinic, and once when you are not at a hospital or clinic. Record the average of the two measurements. To check your blood pressure when you are not at a hospital or clinic, you can use:  An automated blood pressure machine at a pharmacy.  A home blood pressure monitor.  If you are 56-27 years old, ask your health care provider if you should take aspirin to prevent heart disease.  Diabetes screening involves taking a blood sample to check your fasting blood sugar level. This should be done once every 3 years after age 74 if you are at a normal weight and without risk factors for diabetes. Testing should be considered at a younger age or be carried out more frequently if you are overweight and have at least 1 risk factor for diabetes.  Colorectal cancer can be detected and often  prevented. Most routine colorectal cancer screening begins at the age of 30 and continues through age 74. However, your health care provider may recommend screening at an earlier age if you have risk factors for colon cancer. On a yearly basis, your health care provider may provide home test kits to check for hidden blood in the stool. A small camera at the end of a tube may be used to directly examine the colon (sigmoidoscopy  or colonoscopy) to detect the earliest forms of colorectal cancer. Talk to your health care provider about this at age 44 when routine screening begins. A direct exam of the colon should be repeated every 5-10 years through age 31, unless early forms of precancerous polyps or small growths are found.  People who are at an increased risk for hepatitis B should be screened for this virus. You are considered at high risk for hepatitis B if:  You were born in a country where hepatitis B occurs often. Talk with your health care provider about which countries are considered high risk.  Your parents were born in a high-risk country and you have not received a shot to protect against hepatitis B (hepatitis B vaccine).  You have HIV or AIDS.  You use needles to inject street drugs.  You live with, or have sex with, someone who has hepatitis B.  You are a man who has sex with other men (MSM).  You get hemodialysis treatment.  You take certain medicines for conditions like cancer, organ transplantation, and autoimmune conditions.  Hepatitis C blood testing is recommended for all people born from [birth year] through 1966 and any individual with known risk factors for hepatitis C.  Healthy men should no longer receive prostate-specific antigen (PSA) blood tests as part of routine cancer screening. Talk to your health care provider about prostate cancer screening.  Testicular cancer screening is not recommended for adolescents or adult males who have no symptoms. Screening includes  self-exam, a health care provider exam, and other screening tests. Consult with your health care provider about any symptoms you have or any concerns you have about testicular cancer.  Practice safe sex. Use condoms and avoid high-risk sexual practices to reduce the spread of sexually transmitted infections (STIs).  You should be screened for STIs, including gonorrhea and chlamydia if:  You are sexually active and are younger than 24 years.  You are older than 24 years, and your health care provider tells you that you are at risk for this type of infection.  Your sexual activity has changed since you were last screened, and you are at an increased risk for chlamydia or gonorrhea. Ask your health care provider if you are at risk.  If you are at risk of being infected with HIV, it is recommended that you take a prescription medicine daily to prevent HIV infection. This is called pre-exposure prophylaxis (PrEP). You are considered at risk if:  You are a man who has sex with other men (MSM).  You are a heterosexual man who is sexually active with multiple partners.  You take drugs by injection.  You are sexually active with a partner who has HIV.  Talk with your health care provider about whether you are at high risk of being infected with HIV. If you choose to begin PrEP, you should first be tested for HIV. You should then be tested every 3 months for as long as you are taking PrEP.  Use sunscreen. Apply sunscreen liberally and repeatedly throughout the day. You should seek shade when your shadow is shorter than you. Protect yourself by wearing long sleeves, pants, a wide-brimmed hat, and sunglasses year round whenever you are outdoors.  Tell your health care provider of new moles or changes in moles, especially if there is a change in shape or color. Also, tell your health care provider if a mole is larger than the size of a pencil eraser.  A one-time screening for abdominal aortic  aneurysm  (AAA) and surgical repair of large AAAs by ultrasound is recommended for men aged 72-75 years who are current or former smokers.  Stay current with your vaccines (immunizations).   This information is not intended to replace advice given to you by your health care provider. Make sure you discuss any questions you have with your health care provider.   Document Released: 05/14/2008 Document Revised: 12/07/2014 Document Reviewed: 04/13/2011 Elsevier Interactive Patient Education 2016 [employer].  Hearing Loss Hearing loss is a partial or total loss of the ability to hear. This can be temporary or permanent, and it can happen in one or both ears. Hearing loss may be referred to as deafness. Medical care is necessary to treat hearing loss properly and to prevent the condition from getting worse. Your hearing may partially or completely come back, depending on what caused your hearing loss and how severe it is. In some cases, hearing loss is permanent. CAUSES Common causes of hearing loss include:   Too much wax in the ear canal.   Infection of the ear canal or middle ear.   Fluid in the middle ear.   Injury to the ear or surrounding area.   An object stuck in the ear.   Prolonged exposure to loud sounds, such as music.  Less common causes of hearing loss include:   Tumors in the ear.   Viral or bacterial infections, such as meningitis.   A hole in the eardrum (perforated eardrum).  Problems with the hearing nerve that sends signals between the brain and the ear.  Certain medicines.  SYMPTOMS  Symptoms of this condition may include:  Difficulty telling the difference between sounds.  Difficulty following a conversation when there is background noise.  Lack of response to sounds in your environment. This may be most noticeable when you do not respond to startling sounds.  Needing to turn up the volume on the television, radio, etc.  Ringing in the  ears.  Dizziness.  Pain in the ears. DIAGNOSIS This condition is diagnosed based on a physical exam and a hearing test (audiometry). The audiometry test will be performed by a hearing specialist (audiologist). You may also be referred to an ear, nose, and throat (ENT) specialist (otolaryngologist).  TREATMENT Treatment for recent onset of hearing loss may include:   Ear wax removal.   Being prescribed medicines to prevent infection (antibiotics).   Being prescribed medicines to reduce inflammation (corticosteroids).  HOME CARE INSTRUCTIONS  If you were prescribed an antibiotic medicine, take it as told by your health care provider. Do not stop taking the antibiotic even if you start to feel better.  Take over-the-counter and prescription medicines only as told by your health care provider.  Avoid loud noises.   Return to your normal activities as told by your health care provider. Ask your health care provider what activities are safe for you.  Keep all follow-up visits as told by your health care provider. This is important. SEEK MEDICAL CARE IF:   You feel dizzy.   You develop new symptoms.   You vomit or feel nauseous.   You have a fever.  SEEK IMMEDIATE MEDICAL CARE IF:  You develop sudden changes in your vision.   You have severe ear pain.   You have new or increased weakness.  You have a severe headache.   This information is not intended to replace advice given to you by your health care provider. Make sure you discuss any questions you  have with your health care provider.   Document Released: 11/16/2005 Document Revised: 08/07/2015 Document Reviewed: 04/03/2015 Elsevier Interactive Patient Education [employer].

## 2015-09-06 ENCOUNTER — Encounter: Payer: Self-pay | Admitting: Internal Medicine

## 2015-09-06 DIAGNOSIS — Z1211 Encounter for screening for malignant neoplasm of colon: Secondary | ICD-10-CM

## 2015-09-11 LAB — COLOGUARD: COLOGUARD: POSITIVE

## 2015-09-23 NOTE — Telephone Encounter (Signed)
Pt notified of positive cologuard test via my chart.  Need to refer to GI.

## 2015-09-23 NOTE — Telephone Encounter (Signed)
Order already placed for referral to Dr Gustavo Lah.

## 2015-09-23 NOTE — Addendum Note (Signed)
Addended by: Alisa Graff on: 09/23/2015 10:34 PM   Modules accepted: Orders

## 2015-09-23 NOTE — Telephone Encounter (Signed)
Order placed for GI referral.   

## 2015-09-30 NOTE — Telephone Encounter (Signed)
Please send the information over the GI.  Pt has an appt with Christianne (Dr Gustavo Lah) on Wednesday.  See there my chart message.  Please do referral and send info.  Thanks.

## 2015-10-16 DIAGNOSIS — Z1211 Encounter for screening for malignant neoplasm of colon: Secondary | ICD-10-CM | POA: Insufficient documentation

## 2015-12-09 ENCOUNTER — Encounter: Admission: RE | Payer: Self-pay | Source: Ambulatory Visit

## 2015-12-09 ENCOUNTER — Ambulatory Visit: Admission: RE | Admit: 2015-12-09 | Payer: Medicare Other | Source: Ambulatory Visit | Admitting: Gastroenterology

## 2015-12-09 SURGERY — COLONOSCOPY WITH PROPOFOL
Anesthesia: General

## 2016-01-03 ENCOUNTER — Encounter: Payer: Self-pay | Admitting: *Deleted

## 2016-01-06 ENCOUNTER — Ambulatory Visit: Payer: Medicare Other | Admitting: Anesthesiology

## 2016-01-06 ENCOUNTER — Ambulatory Visit
Admission: RE | Admit: 2016-01-06 | Discharge: 2016-01-06 | Disposition: A | Discharge status: Home / Self Care | Payer: Medicare Other | Source: Ambulatory Visit | Attending: Gastroenterology | Admitting: Gastroenterology

## 2016-01-06 ENCOUNTER — Encounter: Payer: Self-pay | Admitting: *Deleted

## 2016-01-06 ENCOUNTER — Encounter
Admission: RE | Disposition: A | Discharge status: Home / Self Care | Payer: Self-pay | Source: Ambulatory Visit | Attending: Gastroenterology

## 2016-01-06 DIAGNOSIS — D125 Benign neoplasm of sigmoid colon: Secondary | ICD-10-CM | POA: Diagnosis not present

## 2016-01-06 DIAGNOSIS — Z9889 Other specified postprocedural states: Secondary | ICD-10-CM | POA: Insufficient documentation

## 2016-01-06 DIAGNOSIS — K635 Polyp of colon: Secondary | ICD-10-CM | POA: Diagnosis not present

## 2016-01-06 DIAGNOSIS — Z85828 Personal history of other malignant neoplasm of skin: Secondary | ICD-10-CM | POA: Insufficient documentation

## 2016-01-06 DIAGNOSIS — Z1211 Encounter for screening for malignant neoplasm of colon: Secondary | ICD-10-CM | POA: Insufficient documentation

## 2016-01-06 DIAGNOSIS — K5730 Diverticulosis of large intestine without perforation or abscess without bleeding: Secondary | ICD-10-CM | POA: Insufficient documentation

## 2016-01-06 DIAGNOSIS — R195 Other fecal abnormalities: Secondary | ICD-10-CM | POA: Diagnosis present

## 2016-01-06 DIAGNOSIS — E7800 Pure hypercholesterolemia, unspecified: Secondary | ICD-10-CM | POA: Insufficient documentation

## 2016-01-06 DIAGNOSIS — K640 First degree hemorrhoids: Secondary | ICD-10-CM | POA: Insufficient documentation

## 2016-01-06 HISTORY — PX: COLONOSCOPY WITH PROPOFOL: SHX5780

## 2016-01-06 SURGERY — COLONOSCOPY WITH PROPOFOL
Anesthesia: General

## 2016-01-06 MED ORDER — SODIUM CHLORIDE 0.9 % IV SOLN
INTRAVENOUS | Status: DC
  Administered 2016-01-06: 1000 mL via INTRAVENOUS

## 2016-01-06 MED ORDER — PROPOFOL 500 MG/50ML IV EMUL
INTRAVENOUS | Status: DC | PRN
  Administered 2016-01-06: 130 ug/kg/min via INTRAVENOUS

## 2016-01-06 MED ORDER — PROPOFOL 10 MG/ML IV BOLUS
INTRAVENOUS | Status: DC | PRN
  Administered 2016-01-06: 70 mg via INTRAVENOUS

## 2016-01-06 MED ORDER — SODIUM CHLORIDE 0.9 % IV SOLN: INTRAVENOUS | Status: DC

## 2016-01-06 NOTE — Op Note (Signed)
[facility] Gastroenterology Patient Name: Troy Arnold Procedure Date: 01/06/2016 11:13 AM MRN: [id number] Account #: [id number] Date of Birth: 1946-11-16 Admit Type: Outpatient Age: 70 Room: [facility] ENDO ROOM 3 Gender: Male Note Status: Finalized Procedure:         Colonoscopy Indications:       Screening for colorectal malignant neoplasm, positive                     cologard Providers:         Lollie Sails, MD Referring MD:      Einar Pheasant, MD (Referring MD) Medicines:         Monitored Anesthesia Care Complications:     No immediate complications. Procedure:         Pre-Anesthesia Assessment:                    - ASA Grade Assessment: III - A patient with severe                     systemic disease.                    After obtaining informed consent, the colonoscope was                     passed under direct vision. Throughout the procedure, the                     patient's blood pressure, pulse, and oxygen saturations                     were monitored continuously. The Olympus PCF-H180AL                     colonoscope ( S#: [id number] ) was introduced through the                     anus and advanced to the the cecum, identified by                     appendiceal orifice and ileocecal valve. The colonoscopy                     was performed with moderate difficulty due to significant                     looping. Successful completion of the procedure was aided                     by using manual pressure. The patient tolerated the                     procedure well. The quality of the bowel preparation was                     fair. Findings:      A 4 mm polyp was found in the distal sigmoid colon. The polyp was       sessile. The polyp was removed with a cold biopsy forceps. Resection and       retrieval were complete.      A 5 mm polyp was found in the mid sigmoid colon. The polyp was flat. The       polyp was removed with a cold snare.  Resection and retrieval  were       complete.      Multiple small-mouthed diverticula were found in the sigmoid colon, in       the descending colon and in the distal transverse colon.      Non-bleeding internal hemorrhoids were found during retroflexion. The       hemorrhoids were Grade I (internal hemorrhoids that do not prolapse).      The digital rectal exam was normal, mild stenotic ring at the anal verge       noted. Impression:        - One 4 mm polyp in the distal sigmoid colon. Resected and                     retrieved.                    - One 5 mm polyp in the mid sigmoid colon. Resected and                     retrieved.                    - Diverticulosis in the sigmoid colon, in the descending                     colon and in the distal transverse colon.                    - Non-bleeding internal hemorrhoids. Recommendation:    - Await pathology results.                    - Telephone GI clinic for pathology results in 1 week. Procedure Code(s): --- Professional ---                    [phone], Colonoscopy, flexible; with removal of tumor(s),                     polyp(s), or other lesion(s) by snare technique                    45380, 38, Colonoscopy, flexible; with biopsy, single or                     multiple Diagnosis Code(s): --- Professional ---                    Z12.11, Encounter for screening for malignant neoplasm of                     colon                    D12.5, Benign neoplasm of sigmoid colon                    K64.0, First degree hemorrhoids                    K57.30, Diverticulosis of large intestine without                     perforation or abscess without bleeding CPT copyright 2014 American Medical Association. All rights reserved. The codes documented in this report are preliminary and upon coder review may  be revised to meet current compliance requirements. Lollie Sails, MD 01/06/2016 11:54:59 AM This report has been signed  electronically. Number of Addenda: 0 Note Initiated On: 01/06/2016 11:13 AM Scope Withdrawal Time: 0 hours 13 minutes 1 second  Total Procedure Duration: 0 hours 28 minutes 54 seconds       [facility]

## 2016-01-06 NOTE — H&P (Signed)
Outpatient short stay form Pre-procedure 01/06/2016 11:16 AM Troy Sails MD  Primary Physician: Dr. Einar Pheasant  Reason for visit:  Colonoscopy  History of present illness:  Patient is a 70 year old male presenting today for colonoscopy. He's never had a colonoscopy before. He did have a cold R test recently that came back as being positive. He tolerated his prep well. He denies use of any blood thinning medications or aspirin products.    Current facility-administered medications:  .  0.9 %  sodium chloride infusion, , Intravenous, Continuous, Troy Sails, MD, Last Rate: 20 mL/hr at 01/06/16 1101, 1,000 mL at 01/06/16 1101 .  0.9 %  sodium chloride infusion, , Intravenous, Continuous, Troy Sails, MD  No prescriptions prior to admission     No Known Allergies   Past Medical History  Diagnosis Date  . Anemia   . Hypercholesterolemia   . Cancer [facility])     skin    Review of systems:      Physical Exam    Heart and lungs: Regular rate and rhythm without rub or gallop, lungs are bilaterally clear.    HEENT: Normocephalic atraumatic eyes are anicteric    Other:     Pertinant exam for procedure: Soft nontender nondistended bowel sounds positive normoactive.    Planned proceedures: Anoscopy and indicated procedures. I have discussed the risks benefits and complications of procedures to include not limited to bleeding, infection, perforation and the risk of sedation and the patient wishes to proceed.    Troy Sails, MD Gastroenterology 01/06/2016  11:16 AM

## 2016-01-06 NOTE — Anesthesia Preprocedure Evaluation (Signed)
Anesthesia Evaluation  Patient identified by MRN, date of birth, ID band Patient awake    Reviewed: Allergy & Precautions, H&P , NPO status , Patient's Chart, lab work & pertinent test results  History of Anesthesia Complications Negative for: history of anesthetic complications  Airway Mallampati: III  TM Distance: <3 FB Neck ROM: limited    Dental  (+) Poor Dentition, Chipped   Pulmonary neg pulmonary ROS, neg shortness of breath,    Pulmonary exam normal breath sounds clear to auscultation       Cardiovascular Exercise Tolerance: Good (-) angina(-) Past MI and (-) DOE negative cardio ROS Normal cardiovascular exam Rhythm:regular Rate:Normal     Neuro/Psych negative neurological ROS  negative psych ROS   GI/Hepatic negative GI ROS, Neg liver ROS, neg GERD  ,  Endo/Other  negative endocrine ROS  Renal/GU negative Renal ROS  negative genitourinary   Musculoskeletal   Abdominal   Peds  Hematology negative hematology ROS (+)   Anesthesia Other Findings Past Medical History:   Anemia                                                       Hypercholesterolemia                                         Cancer ([place])                                                   Comment:skin  Past Surgical History:   [place]         SKIN CANCER EXCISION                             4/15           Comment:BCCA-shoulder   HERNIA REPAIR                                                 Rod placed Rt leg                               Right              plate in rt forearm                             Right              INGUINAL HERNIA REPAIR                          Right 06/25/2015  Comment:Procedure: HERNIA REPAIR INGUINAL ADULT;                Surgeon: Leonie Green, MD;  Location:               [facility] ORS;  Service: General;  Laterality:               Right;  BMI    Body Mass  Index   22.52 kg/m 2      Reproductive/Obstetrics negative OB ROS                             Anesthesia Physical Anesthesia Plan  ASA: III  Anesthesia Plan: General   Post-op Pain Management:    Induction:   Airway Management Planned:   Additional Equipment:   Intra-op Plan:   Post-operative Plan:   Informed Consent: I have reviewed the patients History and Physical, chart, labs and discussed the procedure including the risks, benefits and alternatives for the proposed anesthesia with the patient or authorized representative who has indicated his/her understanding and acceptance.   Dental Advisory Given  Plan Discussed with: Anesthesiologist, CRNA and Surgeon  Anesthesia Plan Comments:         Anesthesia Quick Evaluation

## 2016-01-06 NOTE — Transfer of Care (Signed)
Immediate Anesthesia Transfer of Care Note  Patient: Troy Arnold  Procedure(s) Performed: Procedure(s): COLONOSCOPY WITH PROPOFOL (N/A)  Patient Location: Endoscopy Unit  Anesthesia Type:General  Level of Consciousness: sedated  Airway & Oxygen Therapy: Patient Spontanous Breathing and Patient connected to nasal cannula oxygen  Post-op Assessment: Report given to RN and Post -op Vital signs reviewed and stable  Post vital signs: Reviewed and stable  Last Vitals:  Filed Vitals:   01/06/16 1043  BP: 136/81  Pulse: 86  Temp: 36.2 C  Resp: 16    Complications: No apparent anesthesia complications

## 2016-01-06 NOTE — Anesthesia Postprocedure Evaluation (Signed)
Anesthesia Post Note  Patient: Troy Arnold  Procedure(s) Performed: Procedure(s) (LRB): COLONOSCOPY WITH PROPOFOL (N/A)  Patient location during evaluation: Endoscopy Anesthesia Type: General Level of consciousness: awake and alert Pain management: pain level controlled Vital Signs Assessment: post-procedure vital signs reviewed and stable Respiratory status: spontaneous breathing, nonlabored ventilation, respiratory function stable and patient connected to nasal cannula oxygen Cardiovascular status: blood pressure returned to baseline and stable Postop Assessment: no signs of nausea or vomiting Anesthetic complications: no    Last Vitals:  Filed Vitals:   01/06/16 1214 01/06/16 1224  BP: 104/64 100/71  Pulse: 77 75  Temp:    Resp: 13 24    Last Pain:  Filed Vitals:   01/06/16 1232  PainSc: [place]

## 2016-01-07 ENCOUNTER — Encounter: Payer: Self-pay | Admitting: Internal Medicine

## 2016-01-07 ENCOUNTER — Encounter: Payer: Self-pay | Admitting: Gastroenterology

## 2016-01-07 DIAGNOSIS — Z86010 Personal history of colonic polyps: Secondary | ICD-10-CM | POA: Insufficient documentation

## 2016-01-07 LAB — SURGICAL PATHOLOGY

## 2016-01-22 ENCOUNTER — Encounter: Payer: Self-pay | Admitting: Internal Medicine

## 2016-01-27 ENCOUNTER — Encounter: Payer: Self-pay | Admitting: Podiatry

## 2016-01-27 ENCOUNTER — Ambulatory Visit (INDEPENDENT_AMBULATORY_CARE_PROVIDER_SITE_OTHER): Payer: Medicare Other | Admitting: Podiatry

## 2016-01-27 VITALS — BP 99/81 | HR 78 | Resp 12

## 2016-01-27 DIAGNOSIS — L603 Nail dystrophy: Secondary | ICD-10-CM

## 2016-01-27 NOTE — Progress Notes (Signed)
   Subjective:    [name], male    DOB: 02-11-1946, 70 y.o.   MRN: [id number]  HPI: He presents today chief complaint of thick on attractive nail plate hallux bilateral and second third digits of the left foot. He states that he is tired of looking at them.    Review of Systems  Skin: Positive for color change.       Objective:   Physical Exam: Vital signs are stable he is alert and oriented 3 well Well-nourished individual. No apparent distress. Pulses are strongly palpable bilateral. Neurologic sensorium is intact deep tendon reflexes are intact muscle strength is intact bilateral. Orthopedic evaluation of his traits rectus foot type bilateral. No osseous abnormalities. Cutaneous evaluation and shows supple well-hydrated cutis no open lesions or wounds. Thick nail plates slow-growing hallux bilateral second and third digits of the left foot.      Assessment & Plan:  Nail dystrophy bilateral foot.  Plan: Samples of the nail were sent today for pathologic evaluation expected to reveal nail dystrophy.

## 2016-04-28 IMAGING — CT CT ABD-PELV W/ CM
1 of 3 series · 13 of 32 positions shown, 18 images · IV contrast (omnipaque)
Comparison: None.

CLINICAL DATA: 69-year-old with right lower quadrant pain for 4
hours. History of right groin hernia repair.

EXAM:
CT ABDOMEN AND PELVIS WITH CONTRAST
TECHNIQUE: Multidetector CT imaging of the abdomen and pelvis was performed
using the standard protocol following bolus administration of
intravenous contrast.
CONTRAST:  100mL OMNIPAQUE IOHEXOL 300 MG/ML  SOLN

[Series 2: routine abd pel with · axial · 0.71mm/px · z∈[-1174,-728]mm · 13 of 101 slices shown, 18 images]
[im 6/101  soft-tissue]
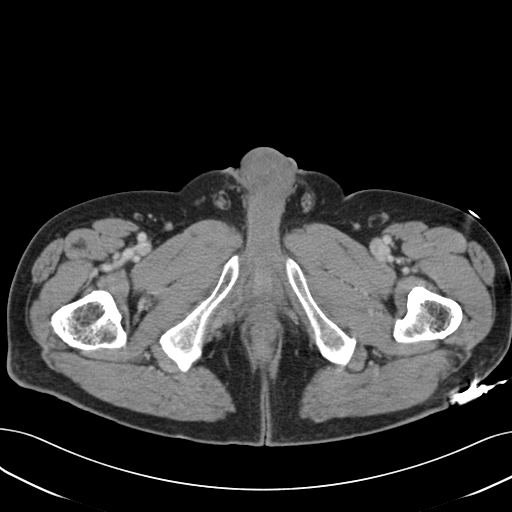
[im 6/101  bone]
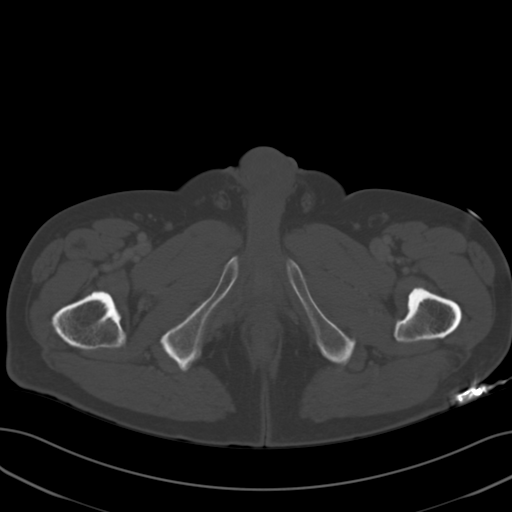
[im 17/101  soft-tissue]
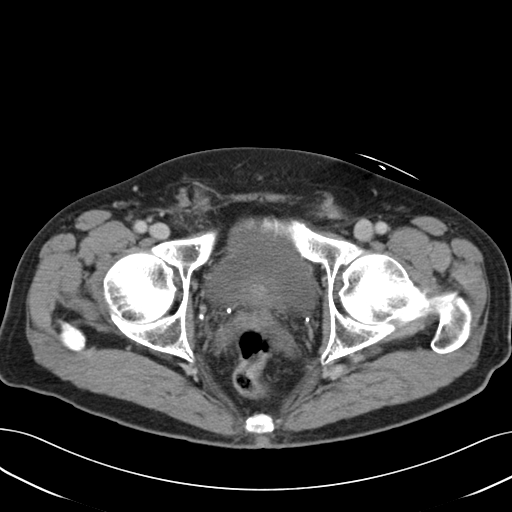
[im 23/101  soft-tissue]
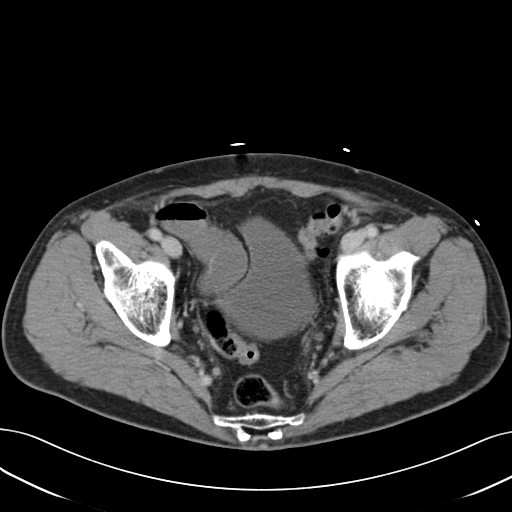
[im 28/101  soft-tissue]
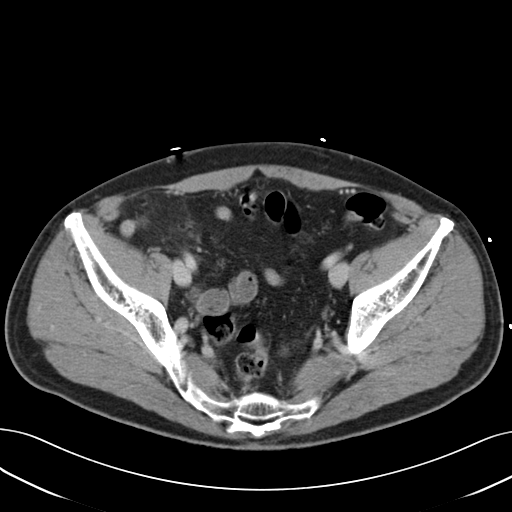
[im 39/101  soft-tissue]
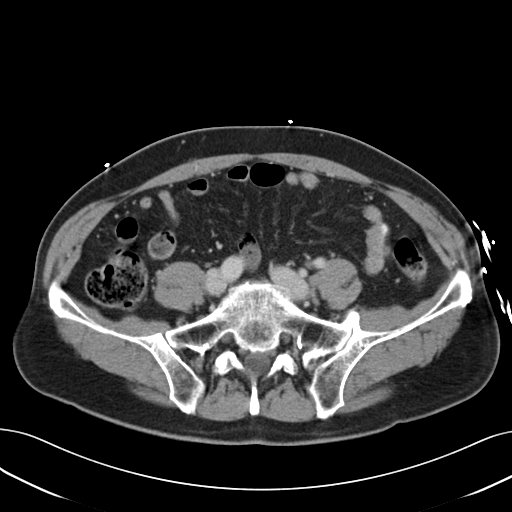
[im 45/101  soft-tissue]
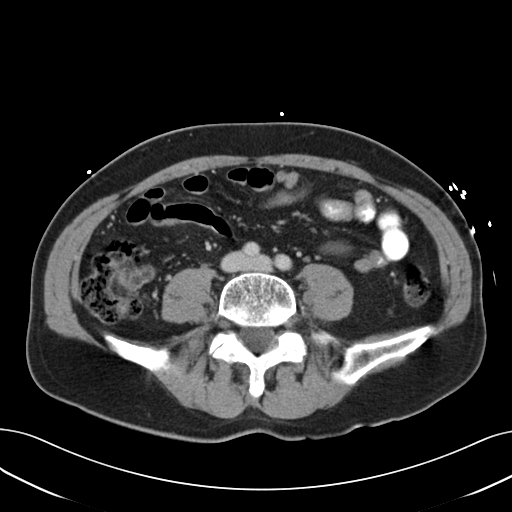
[im 56/101  soft-tissue]
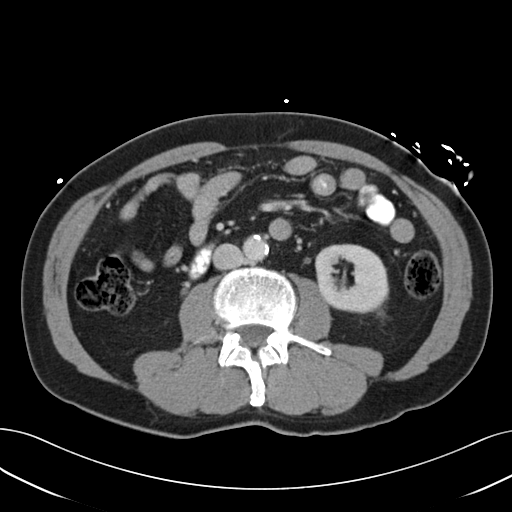
[im 62/101  soft-tissue]
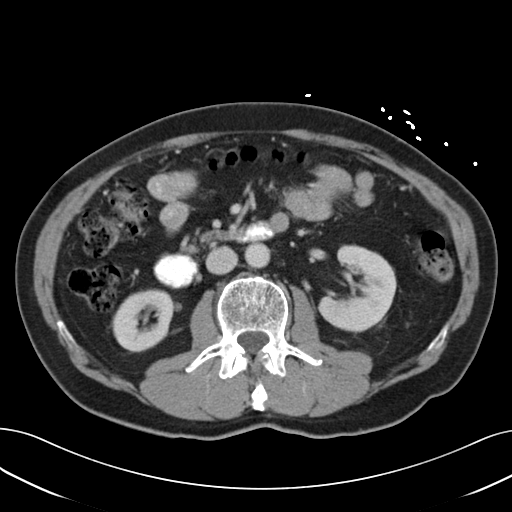
[im 73/101  soft-tissue]
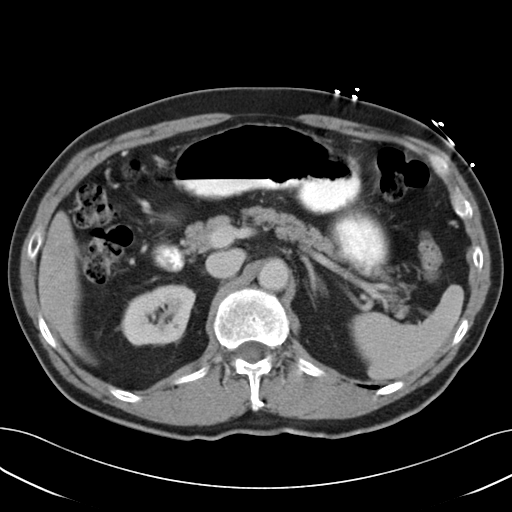
[im 73/101  bone]
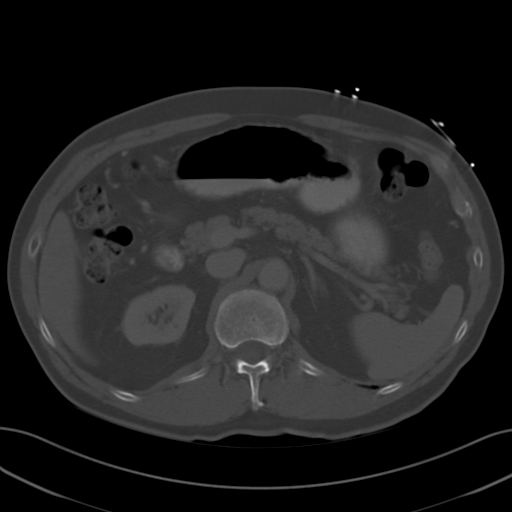
[im 78/101  soft-tissue]
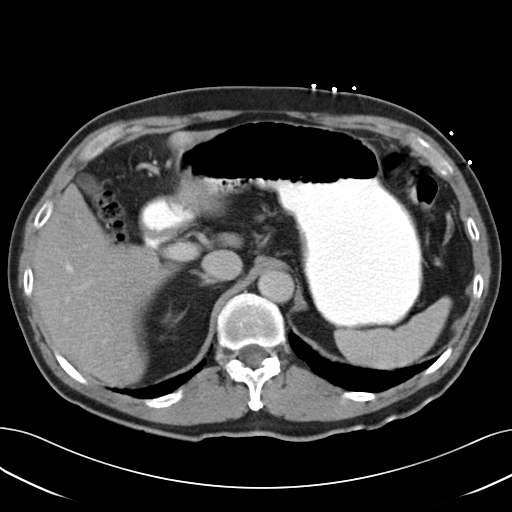
[im 78/101  lung]
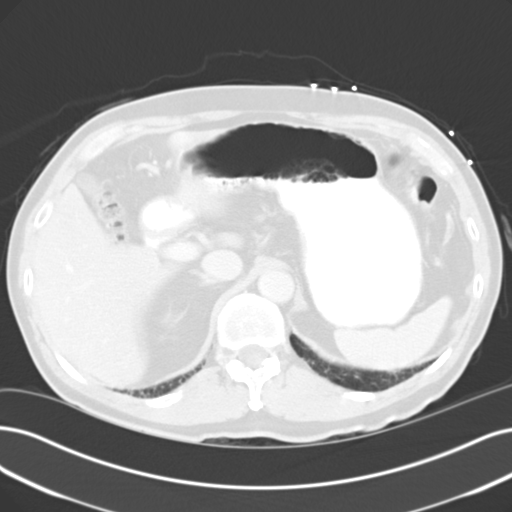
[im 84/101  soft-tissue]
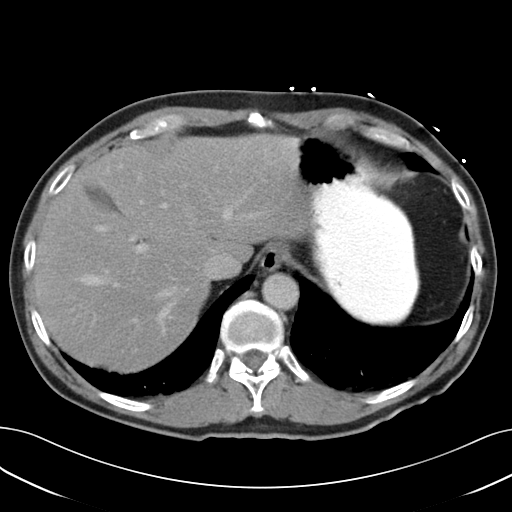
[im 84/101  lung]
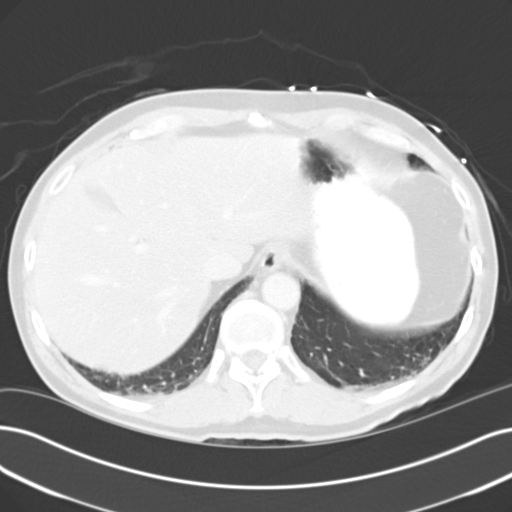
[im 89/101  lung]
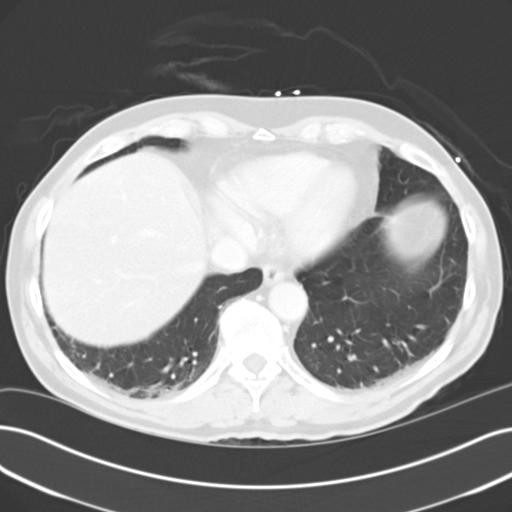
[im 95/101  soft-tissue]
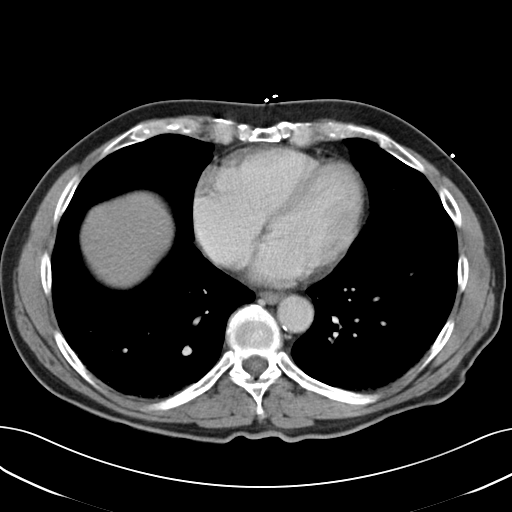
[im 95/101  lung]
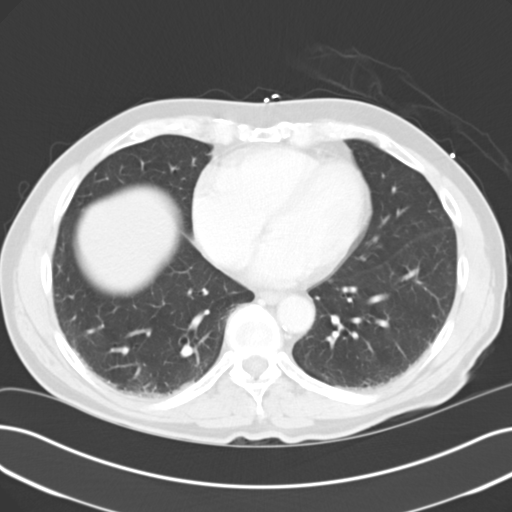

[13 of 32 positions shown; findings below may reference images not displayed]

FINDINGS: There is a punctate nodular density in the right middle lobe on
sequence 5, image 3. Punctate nodular density at the right lung base
on image 22. Few basilar lung densities could represent atelectasis.
No large pleural effusions. Punctate nodular density in the left
lower lobe on image 9. Negative for free air.

Normal appearance of the liver, gallbladder and portal venous
system. Normal appearance of the pancreas. Normal appearance of the
spleen and adrenal glands. Normal appearance of both kidneys.

Few atherosclerotic calcifications in the abdominal aorta without
aneurysmal dilatation. Right common iliac artery is ectatic
measuring up to 1.7 cm. Normal appearance of the prostate and
urinary bladder.

Normal appearance of the colon and appendix. There is a focally
dilated loop of small bowel in the right lower quadrant with
surrounding fluid. There is small amount of edema in the mesentery
within the right lower quadrant. There is a small right inguinal
hernia containing fat and probably bowel mesentery. The small bowel
distal to the dilated loop is decompressed. There is no significant
dilatation of the proximal small bowel loops.

No acute bone abnormality.
IMPRESSION: There is a small right inguinal hernia which contains fat and
possibly mesentery. There is a small amount of edema within this
inguinal sac. Adjacent to the right inguinal hernia, there is a
short segment of dilated small bowel with adjacent fluid. Findings
are concerning for an incarcerated inguinal hernia.

Punctate nodular densities at lung bases are indeterminate. If the
patient is at high risk for bronchogenic carcinoma, follow-up chest
CT at 1 year is recommended. If the patient is at low risk, no
follow-up is needed. This recommendation follows the consensus
statement: Guidelines for Management of Small Pulmonary Nodules
Detected on CT Scans: A Statement from the [HOSPITAL] as
published in Radiology 4774; [DATE].

These results were called by telephone at the time of interpretation
on 05/24/2015 at [DATE] to Dr. JIGISHA DABROWSKI , who verbally
acknowledged these results.

## 2016-09-04 ENCOUNTER — Ambulatory Visit: Payer: Medicare Other

## 2016-09-17 ENCOUNTER — Ambulatory Visit (INDEPENDENT_AMBULATORY_CARE_PROVIDER_SITE_OTHER): Payer: Medicare Other | Admitting: Internal Medicine

## 2016-09-17 ENCOUNTER — Encounter: Payer: Self-pay | Admitting: Internal Medicine

## 2016-09-17 VITALS — BP 122/64 | HR 82 | Temp 97.5°F | Ht 70.0 in | Wt 170.2 lb

## 2016-09-17 DIAGNOSIS — Z23 Encounter for immunization: Secondary | ICD-10-CM

## 2016-09-17 DIAGNOSIS — Z86010 Personal history of colonic polyps: Secondary | ICD-10-CM | POA: Diagnosis not present

## 2016-09-17 DIAGNOSIS — C4491 Basal cell carcinoma of skin, unspecified: Secondary | ICD-10-CM

## 2016-09-17 DIAGNOSIS — Z125 Encounter for screening for malignant neoplasm of prostate: Secondary | ICD-10-CM | POA: Diagnosis not present

## 2016-09-17 DIAGNOSIS — E7800 Pure hypercholesterolemia, unspecified: Secondary | ICD-10-CM

## 2016-09-17 DIAGNOSIS — D649 Anemia, unspecified: Secondary | ICD-10-CM

## 2016-09-17 DIAGNOSIS — Z0000 Encounter for general adult medical examination without abnormal findings: Secondary | ICD-10-CM

## 2016-09-17 LAB — CBC WITH DIFFERENTIAL/PLATELET
Basophils Absolute: 0 10*3/uL (ref 0.0–0.1)
Basophils Relative: 0.6 % (ref 0.0–3.0)
EOS ABS: 0.2 10*3/uL (ref 0.0–0.7)
Eosinophils Relative: 4.5 % (ref 0.0–5.0)
HCT: 42.5 % (ref 39.0–52.0)
HEMOGLOBIN: 14.4 g/dL (ref 13.0–17.0)
LYMPHS ABS: 1.4 10*3/uL (ref 0.7–4.0)
Lymphocytes Relative: 30 % (ref 12.0–46.0)
MCHC: 34 g/dL (ref 30.0–36.0)
MCV: 92.8 fl (ref 78.0–100.0)
MONO ABS: 0.4 10*3/uL (ref 0.1–1.0)
Monocytes Relative: 9.3 % (ref 3.0–12.0)
NEUTROS PCT: 55.6 % (ref 43.0–77.0)
Neutro Abs: 2.7 10*3/uL (ref 1.4–7.7)
Platelets: 219 10*3/uL (ref 150.0–400.0)
RBC: 4.58 Mil/uL (ref 4.22–5.81)
RDW: 13.4 % (ref 11.5–15.5)
WBC: 4.8 10*3/uL (ref 4.0–10.5)

## 2016-09-17 LAB — COMPREHENSIVE METABOLIC PANEL
ALBUMIN: 4.5 g/dL (ref 3.5–5.2)
ALT: 20 U/L (ref 0–53)
AST: 20 U/L (ref 0–37)
Alkaline Phosphatase: 38 U/L — ABNORMAL LOW (ref 39–117)
BUN: 18 mg/dL (ref 6–23)
CHLORIDE: 105 meq/L (ref 96–112)
CO2: 31 mEq/L (ref 19–32)
CREATININE: 1.11 mg/dL (ref 0.40–1.50)
Calcium: 9.4 mg/dL (ref 8.4–10.5)
GFR: 69.26 mL/min (ref >60.00)
GLUCOSE: 102 mg/dL — AB (ref 70–99)
Potassium: 4.4 mEq/L (ref 3.5–5.1)
SODIUM: 142 meq/L (ref 135–145)
TOTAL PROTEIN: 6.9 g/dL (ref 6.0–8.3)
Total Bilirubin: 0.7 mg/dL (ref 0.2–1.2)

## 2016-09-17 LAB — LIPID PANEL
CHOL/HDL RATIO: 5
Cholesterol: 195 mg/dL (ref 0–200)
HDL: 41.5 mg/dL (ref >39.00)
LDL Cholesterol: 124 mg/dL — ABNORMAL HIGH (ref 0–99)
NONHDL: 153.68
Triglycerides: 147 mg/dL (ref 0.0–149.0)
VLDL: 29.4 mg/dL (ref 0.0–40.0)

## 2016-09-17 LAB — TSH: TSH: 2.87 u[IU]/mL (ref 0.35–4.50)

## 2016-09-17 LAB — PSA, MEDICARE: PSA: 1.18 ng/mL (ref 0.10–4.00)

## 2016-09-17 NOTE — Assessment & Plan Note (Signed)
Colonoscopy 01/07/16 - one 62mm polyp in the distal sigmoid, one 9mm polyp in the mid sigmoid colon, diverticulosis and non bleeding internal hemorrhoids.  Tubular adenomatous polyp - mid sigmoid colon and hyperplastic in distal sigmoid colon.

## 2016-09-17 NOTE — Assessment & Plan Note (Signed)
Followed by Dr Dasher.   

## 2016-09-17 NOTE — Assessment & Plan Note (Signed)
Physical today 09/17/16.  Colonoscopy as outlined.  Check psa.

## 2016-09-17 NOTE — Progress Notes (Signed)
[name], male   DOB: 11-29-1946, 70 y.o.   MRN: [id number]   Subjective:    [name], male    DOB: 1946/07/09, 70 y.o.   MRN: [id number]  HPI  Patient here for his physical exam.  States he is doing well.  Feels good.  No chest pain.  No sob.  No acid reflux.  No abdominal pain or cramping.  Bowels stable.  Had colonoscopy.  Polyps.  Recommended f/u in five years.  Working a lot.  Tries to stay active.     Past Medical History:  Diagnosis Date  . Anemia   . Cancer ([place])    skin  . Hypercholesterolemia    Past Surgical History:  Procedure Laterality Date  . COLONOSCOPY WITH PROPOFOL N/A 01/06/2016   Procedure: COLONOSCOPY WITH PROPOFOL;  Surgeon: Lollie Sails, MD;  Location: [facility] ENDOSCOPY;  Service: Endoscopy;  Laterality: N/A;  . HERNIA REPAIR  1983  . HERNIA REPAIR    . INGUINAL HERNIA REPAIR Right 06/25/2015   Procedure: HERNIA REPAIR INGUINAL ADULT;  Surgeon: Leonie Green, MD;  Location: [facility] ORS;  Service: General;  Laterality: Right;  . plate in rt forearm Right   . Rod placed Rt leg Right   . SKIN CANCER EXCISION  4/15   BCCA-shoulder   Family History  Problem Relation Age of Onset  . Diabetes Mother   . Prostate cancer Neg Hx   . Colon cancer Neg Hx    Social History   Social History  . Marital status: Married    Spouse name: N/A  . Number of children: 2  . Years of education: N/A   Social History Main Topics  . Smoking status: Never Smoker  . Smokeless tobacco: Never Used  . Alcohol use 0.0 oz/week     Comment: rare  . Drug use: No  . Sexual activity: Yes   Other Topics Concern  . None   Social History Narrative  . None    Outpatient Encounter Prescriptions as of 09/17/2016  Medication Sig  . [DISCONTINUED] polyethylene glycol powder (GLYCOLAX/MIRALAX) powder AS DIRECTED FOR COLONIC PREP.   No facility-administered encounter medications on file as of 09/17/2016.     Review of Systems    Constitutional: Negative for appetite change and unexpected weight change.  HENT: Negative for congestion and sinus pressure.   Eyes: Negative for pain and visual disturbance.  Respiratory: Negative for cough, chest tightness and shortness of breath.   Cardiovascular: Negative for chest pain, palpitations and leg swelling.  Gastrointestinal: Negative for abdominal pain, diarrhea, nausea and vomiting.  Genitourinary: Negative for difficulty urinating and dysuria.  Musculoskeletal: Negative for back pain and joint swelling.  Skin: Negative for color change and rash.  Neurological: Negative for dizziness, light-headedness and headaches.  Hematological: Negative for adenopathy. Does not bruise/bleed easily.  Psychiatric/Behavioral: Negative for agitation and dysphoric mood.       Objective:     Blood pressure rechecked by me:  122/64  Physical Exam  Constitutional: He is oriented to person, place, and time. He appears well-developed and well-nourished. No distress.  HENT:  Head: Normocephalic and atraumatic.  Nose: Nose normal.  Mouth/Throat: Oropharynx is clear and moist. No oropharyngeal exudate.  Eyes: Conjunctivae are normal. Right eye exhibits no discharge. Left eye exhibits no discharge.  Neck: Neck supple. No thyromegaly present.  Cardiovascular: Normal rate and regular rhythm.   Pulmonary/Chest: Breath sounds normal. No respiratory distress. He has no wheezes.  Abdominal: Soft. Bowel sounds are normal. There is no tenderness.  Genitourinary:  Genitourinary Comments: Rectal exam - no palpable prostate nodules.    Musculoskeletal: He exhibits no edema or tenderness.  Lymphadenopathy:    He has no cervical adenopathy.  Neurological: He is alert and oriented to person, place, and time.  Skin: Skin is warm and dry. No rash noted. No erythema.  Psychiatric: He has a normal mood and affect. His behavior is normal.    BP 122/64   Pulse 82   Temp 97.5 F (36.4 C) (Oral)   Ht  5\' 10"  (1.778 m)   Wt 170 lb 3.2 oz (77.2 kg)   SpO2 95%   BMI 24.42 kg/m  Wt Readings from Last 3 Encounters:  09/17/16 170 lb 3.2 oz (77.2 kg)  01/06/16 157 lb (71.2 kg)  09/05/15 165 lb 9.6 oz (75.1 kg)     Lab Results  Component Value Date   WBC 7.6 05/24/2015   HGB 14.3 05/24/2015   HCT 43.2 05/24/2015   PLT 193 05/24/2015   GLUCOSE 139 (H) 05/24/2015   CHOL 169 04/08/2015   TRIG 136.0 04/08/2015   HDL 44.60 04/08/2015   LDLCALC 97 04/08/2015   ALT 20 05/24/2015   AST 24 05/24/2015   NA 139 05/24/2015   K 4.6 05/24/2015   CL 103 05/24/2015   CREATININE 1.08 05/24/2015   BUN 16 05/24/2015   CO2 26 05/24/2015   TSH 2.37 04/08/2015   PSA 1.77 04/08/2015       Assessment & Plan:   Problem List Items Addressed This Visit    Anemia    Recheck cbc today.  Not on iron supplements.       Basal cell carcinoma    Followed by Dr Evorn Gong.        Health care maintenance    Physical today 09/17/16.  Colonoscopy as outlined.  Check psa.       History of colonic polyps    Colonoscopy 01/07/16 - one 72mm polyp in the distal sigmoid, one 68mm polyp in the mid sigmoid colon, diverticulosis and non bleeding internal hemorrhoids.  Tubular adenomatous polyp - mid sigmoid colon and hyperplastic in distal sigmoid colon.        Hypercholesterolemia    Low cholesterol diet and exercise.  Follow lipid panel.       Relevant Orders   CBC with Differential/Platelet   Comprehensive metabolic panel   TSH   Lipid panel    Other Visit Diagnoses    Prostate cancer screening    -  Primary   Relevant Orders   PSA, Medicare       Troy Pheasant, MD

## 2016-09-17 NOTE — Assessment & Plan Note (Signed)
Low cholesterol diet and exercise.  Follow lipid panel.   

## 2016-09-17 NOTE — Assessment & Plan Note (Signed)
Recheck cbc today.  Not on iron supplements.

## 2016-09-17 NOTE — Progress Notes (Signed)
Pre visit review using our clinic review tool, if applicable. No additional management support is needed unless otherwise documented below in the visit note. 

## 2016-09-18 ENCOUNTER — Encounter: Payer: Self-pay | Admitting: Internal Medicine

## 2017-09-13 ENCOUNTER — Ambulatory Visit (INDEPENDENT_AMBULATORY_CARE_PROVIDER_SITE_OTHER): Payer: Medicare Other

## 2017-09-13 DIAGNOSIS — Z23 Encounter for immunization: Secondary | ICD-10-CM | POA: Diagnosis not present

## 2017-09-16 ENCOUNTER — Encounter: Payer: Self-pay | Admitting: Internal Medicine

## 2017-09-20 ENCOUNTER — Ambulatory Visit (INDEPENDENT_AMBULATORY_CARE_PROVIDER_SITE_OTHER): Payer: Medicare Other | Admitting: Internal Medicine

## 2017-09-20 ENCOUNTER — Encounter: Payer: Self-pay | Admitting: Internal Medicine

## 2017-09-20 VITALS — BP 120/64 | HR 78 | Temp 98.6°F | Resp 14 | Ht 70.0 in | Wt 164.6 lb

## 2017-09-20 DIAGNOSIS — Z23 Encounter for immunization: Secondary | ICD-10-CM | POA: Diagnosis not present

## 2017-09-20 DIAGNOSIS — C4491 Basal cell carcinoma of skin, unspecified: Secondary | ICD-10-CM

## 2017-09-20 DIAGNOSIS — E7800 Pure hypercholesterolemia, unspecified: Secondary | ICD-10-CM

## 2017-09-20 DIAGNOSIS — Z0000 Encounter for general adult medical examination without abnormal findings: Secondary | ICD-10-CM

## 2017-09-20 DIAGNOSIS — Z125 Encounter for screening for malignant neoplasm of prostate: Secondary | ICD-10-CM | POA: Diagnosis not present

## 2017-09-20 DIAGNOSIS — Z860100 Personal history of colon polyps, unspecified: Secondary | ICD-10-CM

## 2017-09-20 DIAGNOSIS — Z862 Personal history of diseases of the blood and blood-forming organs and certain disorders involving the immune mechanism: Secondary | ICD-10-CM | POA: Diagnosis not present

## 2017-09-20 LAB — CBC WITH DIFFERENTIAL/PLATELET
BASOS ABS: 0 10*3/uL (ref 0.0–0.1)
Basophils Relative: 0.7 % (ref 0.0–3.0)
EOS PCT: 3.7 % (ref 0.0–5.0)
Eosinophils Absolute: 0.2 10*3/uL (ref 0.0–0.7)
HCT: 42.3 % (ref 39.0–52.0)
HEMOGLOBIN: 14 g/dL (ref 13.0–17.0)
LYMPHS ABS: 1.3 10*3/uL (ref 0.7–4.0)
LYMPHS PCT: 28.6 % (ref 12.0–46.0)
MCHC: 33.1 g/dL (ref 30.0–36.0)
MCV: 95.2 fl (ref 78.0–100.0)
MONOS PCT: 9.8 % (ref 3.0–12.0)
Monocytes Absolute: 0.4 10*3/uL (ref 0.1–1.0)
Neutro Abs: 2.6 10*3/uL (ref 1.4–7.7)
Neutrophils Relative %: 57.2 % (ref 43.0–77.0)
Platelets: 206 10*3/uL (ref 150.0–400.0)
RBC: 4.44 Mil/uL (ref 4.22–5.81)
RDW: 13.2 % (ref 11.5–15.5)
WBC: 4.5 10*3/uL (ref 4.0–10.5)

## 2017-09-20 LAB — COMPREHENSIVE METABOLIC PANEL
ALBUMIN: 4.4 g/dL (ref 3.5–5.2)
ALK PHOS: 37 U/L — AB (ref 39–117)
ALT: 12 U/L (ref 0–53)
AST: 16 U/L (ref 0–37)
BILIRUBIN TOTAL: 0.9 mg/dL (ref 0.2–1.2)
BUN: 18 mg/dL (ref 6–23)
CALCIUM: 9.4 mg/dL (ref 8.4–10.5)
CO2: 29 meq/L (ref 19–32)
Chloride: 105 mEq/L (ref 96–112)
Creatinine, Ser: 0.97 mg/dL (ref 0.40–1.50)
GFR: 80.69 mL/min (ref >60.00)
Glucose, Bld: 98 mg/dL (ref 70–99)
Potassium: 4.5 mEq/L (ref 3.5–5.1)
Sodium: 141 mEq/L (ref 135–145)
TOTAL PROTEIN: 6.8 g/dL (ref 6.0–8.3)

## 2017-09-20 LAB — LIPID PANEL
CHOLESTEROL: 178 mg/dL (ref 0–200)
HDL: 39 mg/dL — ABNORMAL LOW (ref >39.00)
LDL Cholesterol: 110 mg/dL — ABNORMAL HIGH (ref 0–99)
NonHDL: 138.71
Total CHOL/HDL Ratio: 5
Triglycerides: 146 mg/dL (ref 0.0–149.0)
VLDL: 29.2 mg/dL (ref 0.0–40.0)

## 2017-09-20 LAB — PSA, MEDICARE: PSA: 2.31 ng/mL (ref 0.10–4.00)

## 2017-09-20 LAB — TSH: TSH: 2.63 u[IU]/mL (ref 0.35–4.50)

## 2017-09-20 NOTE — Assessment & Plan Note (Signed)
Up to date with colonoscopy.  Recheck cbc today.

## 2017-09-20 NOTE — Assessment & Plan Note (Signed)
Physical today 09/20/17.  Check psa with labs.  Colonoscopy 01/07/16.  Recommended f/u colonoscopy in 5 years.

## 2017-09-20 NOTE — Progress Notes (Signed)
[name], male   DOB: 04/03/1946, 71 y.o.   MRN: [id number]   Subjective:    [name], male    DOB: 23-Jul-1946, 71 y.o.   MRN: [id number]  HPI  Patient here for his physical exam.  States he is doing well.  Stays active.  No chest pain.  No sob.  No acid reflux.  No abdominal pain.  Bowels moving.  No problems reported with urination.  Feels good.  Sees Dr Evorn Gong.     Past Medical History:  Diagnosis Date  . Anemia   . Cancer ([place])    skin  . Hypercholesterolemia    Past Surgical History:  Procedure Laterality Date  . COLONOSCOPY WITH PROPOFOL N/A 01/06/2016   Procedure: COLONOSCOPY WITH PROPOFOL;  Surgeon: Lollie Sails, MD;  Location: [facility] ENDOSCOPY;  Service: Endoscopy;  Laterality: N/A;  . HERNIA REPAIR  1983  . HERNIA REPAIR    . INGUINAL HERNIA REPAIR Right 06/25/2015   Procedure: HERNIA REPAIR INGUINAL ADULT;  Surgeon: Leonie Green, MD;  Location: [facility] ORS;  Service: General;  Laterality: Right;  . plate in rt forearm Right   . Rod placed Rt leg Right   . SKIN CANCER EXCISION  4/15   BCCA-shoulder   Family History  Problem Relation Age of Onset  . Diabetes Mother   . Prostate cancer Neg Hx   . Colon cancer Neg Hx    Social History   Social History  . Marital status: Married    Spouse name: N/A  . Number of children: 2  . Years of education: N/A   Social History Main Topics  . Smoking status: Never Smoker  . Smokeless tobacco: Never Used  . Alcohol use 0.0 oz/week     Comment: rare  . Drug use: No  . Sexual activity: Yes   Other Topics Concern  . None   Social History Narrative  . None    No outpatient encounter prescriptions on file as of 09/20/2017.   No facility-administered encounter medications on file as of 09/20/2017.     Review of Systems  Constitutional: Negative for appetite change and unexpected weight change.  HENT: Negative for congestion and sinus pressure.   Eyes: Negative for  pain and visual disturbance.  Respiratory: Negative for cough, chest tightness and shortness of breath.   Cardiovascular: Negative for chest pain, palpitations and leg swelling.  Gastrointestinal: Negative for abdominal pain, diarrhea, nausea and vomiting.  Genitourinary: Negative for difficulty urinating and dysuria.  Musculoskeletal: Negative for back pain and joint swelling.  Skin: Negative for color change and rash.  Neurological: Negative for dizziness, light-headedness and headaches.  Hematological: Negative for adenopathy. Does not bruise/bleed easily.  Psychiatric/Behavioral: Negative for agitation and dysphoric mood.       Objective:    Physical Exam  Constitutional: He is oriented to person, place, and time. He appears well-developed and well-nourished. No distress.  HENT:  Head: Normocephalic and atraumatic.  Nose: Nose normal.  Mouth/Throat: Oropharynx is clear and moist. No oropharyngeal exudate.  Eyes: Conjunctivae are normal. Right eye exhibits no discharge. Left eye exhibits no discharge.  Neck: Neck supple. No thyromegaly present.  Cardiovascular: Normal rate and regular rhythm.   Pulmonary/Chest: Breath sounds normal. No respiratory distress. He has no wheezes.  Abdominal: Soft. Bowel sounds are normal. There is no tenderness.  Genitourinary:  Genitourinary Comments: Rectal exam:  No palpable prostate nodules.  Heme negative.    Musculoskeletal: He exhibits  no edema or tenderness.  Lymphadenopathy:    He has no cervical adenopathy.  Neurological: He is alert and oriented to person, place, and time.  Skin: Skin is warm and dry. No rash noted. No erythema.  Psychiatric: He has a normal mood and affect. His behavior is normal.    BP 120/64 (BP Location: Left Arm, Patient Position: Sitting, Cuff Size: Normal)   Pulse 78   Temp 98.6 F (37 C) (Oral)   Resp 14   Ht 5\' 10"  (1.778 m)   Wt 164 lb 9.6 oz (74.7 kg)   SpO2 98%   BMI 23.62 kg/m  Wt Readings from  Last 3 Encounters:  09/20/17 164 lb 9.6 oz (74.7 kg)  09/17/16 170 lb 3.2 oz (77.2 kg)  01/06/16 157 lb (71.2 kg)     Lab Results  Component Value Date   WBC 4.5 09/20/2017   HGB 14.0 09/20/2017   HCT 42.3 09/20/2017   PLT 206.0 09/20/2017   GLUCOSE 98 09/20/2017   CHOL 178 09/20/2017   TRIG 146.0 09/20/2017   HDL 39.00 (L) 09/20/2017   LDLCALC 110 (H) 09/20/2017   ALT 12 09/20/2017   AST 16 09/20/2017   NA 141 09/20/2017   K 4.5 09/20/2017   CL 105 09/20/2017   CREATININE 0.97 09/20/2017   BUN 18 09/20/2017   CO2 29 09/20/2017   TSH 2.63 09/20/2017   PSA 2.31 09/20/2017       Assessment & Plan:   Problem List Items Addressed This Visit    Basal cell carcinoma    Followed by Dr Evorn Gong.        Health care maintenance    Physical today 09/20/17.  Check psa with labs.  Colonoscopy 01/07/16.  Recommended f/u colonoscopy in 5 years.        History of anemia    Up to date with colonoscopy.  Recheck cbc today.       Relevant Orders   CBC with Differential/Platelet (Completed)   History of colonic polyps    Last colonoscopy 01/07/16 as outlined.        Hypercholesterolemia - Primary    Low cholesterol diet and exercise.  Follow lipid panel.  Recheck today.        Relevant Orders   Comprehensive metabolic panel (Completed)   TSH (Completed)   Lipid panel (Completed)    Other Visit Diagnoses    Prostate cancer screening       Relevant Orders   PSA, Medicare (Completed)   Need for pneumococcal vaccination           Einar Pheasant, MD

## 2017-09-21 ENCOUNTER — Encounter: Payer: Self-pay | Admitting: Internal Medicine

## 2017-09-21 NOTE — Assessment & Plan Note (Signed)
Low cholesterol diet and exercise.  Follow lipid panel.  Recheck today.  °

## 2017-09-21 NOTE — Assessment & Plan Note (Signed)
Last colonoscopy 01/07/16 as outlined.

## 2017-09-21 NOTE — Assessment & Plan Note (Signed)
Followed by Dr Dasher.   

## 2017-09-23 ENCOUNTER — Telehealth: Payer: Self-pay | Admitting: Internal Medicine

## 2017-09-23 NOTE — Telephone Encounter (Signed)
See lab note for documentation.  

## 2017-09-23 NOTE — Telephone Encounter (Signed)
Pt spouse called back returning your call. Please advise, thank you!  Call pt @ [phone]

## 2018-01-05 ENCOUNTER — Ambulatory Visit: Payer: Medicare Other | Admitting: Family Medicine

## 2018-01-05 ENCOUNTER — Encounter: Payer: Self-pay | Admitting: Family Medicine

## 2018-01-05 VITALS — BP 120/70 | HR 86 | Temp 98.1°F | Wt 163.0 lb

## 2018-01-05 DIAGNOSIS — J111 Influenza due to unidentified influenza virus with other respiratory manifestations: Secondary | ICD-10-CM | POA: Insufficient documentation

## 2018-01-05 NOTE — Progress Notes (Signed)
   BP 120/70 (BP Location: Left Arm, Patient Position: Sitting, Cuff Size: Normal)   Pulse 86   Temp 98.1 F (36.7 C) (Oral)   Wt 163 lb (73.9 kg)   SpO2 97%   BMI 23.39 kg/m    CC: flu exposure Subjective:    [name], male    DOB: [birth year], 72 y.o.   MRN: [id number]  HPI: Boubacar Lerette is a 72 y.o. male presenting on 01/05/2018 for Flu exposure (Pt was with grandson, who was recently dx with flu. Pt had a mild dizzness a little achiness about 2 days ago. Denies any fever, N/V/D. As of last night feels better and has had flu vaccine.)   Flu exposure early last week (grandson). About 1 wk ago pt felt intermittent body aches, arthralgias, congestion, malaise, L arm burning pain that has since resolved. Yesterday and today feeling great.   Denies fevers/chills, cough, nausea, ST.  Ate some oranges but no other medications tried.   He did receive flu vaccine this year.   Relevant past medical, surgical, family and social history reviewed and updated as indicated. Interim medical history since our last visit reviewed. Allergies and medications reviewed and updated. No outpatient medications prior to visit.   No facility-administered medications prior to visit.      Per HPI unless specifically indicated in ROS section below Review of Systems     Objective:    BP 120/70 (BP Location: Left Arm, Patient Position: Sitting, Cuff Size: Normal)   Pulse 86   Temp 98.1 F (36.7 C) (Oral)   Wt 163 lb (73.9 kg)   SpO2 97%   BMI 23.39 kg/m   Wt Readings from Last 3 Encounters:  01/05/18 163 lb (73.9 kg)  09/20/17 164 lb 9.6 oz (74.7 kg)  09/17/16 170 lb 3.2 oz (77.2 kg)    Physical Exam  Constitutional: He appears well-developed and well-nourished. No distress.  HENT:  Head: Normocephalic and atraumatic.  Right Ear: Hearing, tympanic membrane, external ear and ear canal normal.  Left Ear: Hearing, tympanic membrane, external ear and ear canal normal.   Nose: Nose normal. No mucosal edema or rhinorrhea. Right sinus exhibits no maxillary sinus tenderness and no frontal sinus tenderness. Left sinus exhibits no maxillary sinus tenderness and no frontal sinus tenderness.  Mouth/Throat: Uvula is midline, oropharynx is clear and moist and mucous membranes are normal. No oropharyngeal exudate, posterior oropharyngeal edema, posterior oropharyngeal erythema or tonsillar abscesses.  Eyes: Conjunctivae and EOM are normal. Pupils are equal, round, and reactive to light. No scleral icterus.  Neck: Normal range of motion. Neck supple.  Cardiovascular: Normal rate, regular rhythm, normal heart sounds and intact distal pulses.  No murmur heard. Pulmonary/Chest: Effort normal and breath sounds normal. No respiratory distress. He has no wheezes. He has no rales.  Lymphadenopathy:    He has no cervical adenopathy.  Skin: Skin is warm and dry. No rash noted.  Nursing note and vitals reviewed.     Assessment & Plan:   Problem List Items Addressed This Visit    Influenza - Primary    Anticipate he did have very attenuated case of flu with minimal symptoms - due to receiving influenza vaccine this season. Reassuringly feeling well last 2 days. Benign exam. Reviewed symptoms to update [place] or seek further evaluation. Pt agrees with plan.           Follow up plan: No Follow-up on file.  Ria Bush, MD

## 2018-01-05 NOTE — Patient Instructions (Signed)
You likely did have flu exposure, but I think vaccine helped keep infection at [place]. I'm glad you're feeling better. Let us know if any new symptoms develop.

## 2018-01-05 NOTE — Assessment & Plan Note (Signed)
Anticipate he did have very attenuated case of flu with minimal symptoms - due to receiving influenza vaccine this season. Reassuringly feeling well last 2 days. Benign exam. Reviewed symptoms to update [place] or seek further evaluation. Pt agrees with plan.

## 2018-03-23 ENCOUNTER — Telehealth: Payer: Self-pay | Admitting: Radiology

## 2018-03-23 ENCOUNTER — Other Ambulatory Visit: Payer: Self-pay | Admitting: Internal Medicine

## 2018-03-23 DIAGNOSIS — E7800 Pure hypercholesterolemia, unspecified: Secondary | ICD-10-CM

## 2018-03-23 DIAGNOSIS — Z125 Encounter for screening for malignant neoplasm of prostate: Secondary | ICD-10-CM

## 2018-03-23 NOTE — Telephone Encounter (Signed)
Pt coming in tomorrow for labs, please place future orders. Thank you.  

## 2018-03-23 NOTE — Progress Notes (Signed)
Orders placed for labs

## 2018-03-23 NOTE — Telephone Encounter (Signed)
Orders placed for labs

## 2018-03-24 ENCOUNTER — Other Ambulatory Visit (INDEPENDENT_AMBULATORY_CARE_PROVIDER_SITE_OTHER): Payer: Medicare Other

## 2018-03-24 DIAGNOSIS — E7800 Pure hypercholesterolemia, unspecified: Secondary | ICD-10-CM

## 2018-03-24 DIAGNOSIS — Z125 Encounter for screening for malignant neoplasm of prostate: Secondary | ICD-10-CM | POA: Diagnosis not present

## 2018-03-24 LAB — COMPREHENSIVE METABOLIC PANEL
ALBUMIN: 4.3 g/dL (ref 3.5–5.2)
ALT: 13 U/L (ref 0–53)
AST: 17 U/L (ref 0–37)
Alkaline Phosphatase: 38 U/L — ABNORMAL LOW (ref 39–117)
BUN: 19 mg/dL (ref 6–23)
CALCIUM: 9.3 mg/dL (ref 8.4–10.5)
CHLORIDE: 109 meq/L (ref 96–112)
CO2: 27 meq/L (ref 19–32)
Creatinine, Ser: 1.38 mg/dL (ref 0.40–1.50)
GFR: 53.65 mL/min — ABNORMAL LOW (ref >60.00)
Glucose, Bld: 95 mg/dL (ref 70–99)
Potassium: 5.1 mEq/L (ref 3.5–5.1)
Sodium: 137 mEq/L (ref 135–145)
Total Bilirubin: 0.8 mg/dL (ref 0.2–1.2)
Total Protein: 6.7 g/dL (ref 6.0–8.3)

## 2018-03-24 LAB — LIPID PANEL
CHOLESTEROL: 173 mg/dL (ref 0–200)
HDL: 45.6 mg/dL (ref >39.00)
LDL CALC: 105 mg/dL — AB (ref 0–99)
NonHDL: 127.19
TRIGLYCERIDES: 110 mg/dL (ref 0.0–149.0)
Total CHOL/HDL Ratio: 4
VLDL: 22 mg/dL (ref 0.0–40.0)

## 2018-03-24 LAB — PSA, MEDICARE: PSA: 1.06 ng/ml (ref 0.10–4.00)

## 2018-03-25 ENCOUNTER — Encounter: Payer: Self-pay | Admitting: Internal Medicine

## 2018-03-25 ENCOUNTER — Other Ambulatory Visit: Payer: Self-pay | Admitting: Internal Medicine

## 2018-03-25 DIAGNOSIS — R7989 Other specified abnormal findings of blood chemistry: Secondary | ICD-10-CM

## 2018-03-25 NOTE — Progress Notes (Signed)
Order placed for f/u met b.  

## 2018-04-06 ENCOUNTER — Other Ambulatory Visit (INDEPENDENT_AMBULATORY_CARE_PROVIDER_SITE_OTHER): Payer: Medicare Other

## 2018-04-06 DIAGNOSIS — R7989 Other specified abnormal findings of blood chemistry: Secondary | ICD-10-CM

## 2018-04-06 LAB — BASIC METABOLIC PANEL
BUN: 15 mg/dL (ref 6–23)
CO2: 29 mEq/L (ref 19–32)
Calcium: 9.1 mg/dL (ref 8.4–10.5)
Chloride: 105 mEq/L (ref 96–112)
Creatinine, Ser: 1.01 mg/dL (ref 0.40–1.50)
GFR: 76.9 mL/min (ref >60.00)
Glucose, Bld: 89 mg/dL (ref 70–99)
POTASSIUM: 4.3 meq/L (ref 3.5–5.1)
SODIUM: 140 meq/L (ref 135–145)

## 2018-04-07 ENCOUNTER — Encounter: Payer: Self-pay | Admitting: Internal Medicine

## 2018-05-30 ENCOUNTER — Encounter: Payer: Self-pay | Admitting: Internal Medicine

## 2018-09-15 ENCOUNTER — Ambulatory Visit: Payer: Medicare Other | Admitting: Family Medicine

## 2018-09-15 ENCOUNTER — Encounter: Payer: Self-pay | Admitting: Family Medicine

## 2018-09-15 VITALS — BP 118/84 | HR 77 | Temp 97.7°F | Ht 70.0 in | Wt 163.0 lb

## 2018-09-15 DIAGNOSIS — T148XXA Other injury of unspecified body region, initial encounter: Secondary | ICD-10-CM

## 2018-09-15 DIAGNOSIS — L03114 Cellulitis of left upper limb: Secondary | ICD-10-CM

## 2018-09-15 MED ORDER — CEPHALEXIN 500 MG PO CAPS: 500.0000 mg | ORAL_CAPSULE | Freq: Two times a day (BID) | ORAL | 0 refills | Status: DC

## 2018-09-15 NOTE — Patient Instructions (Signed)

## 2018-09-15 NOTE — Progress Notes (Signed)
   Subjective:    [name], male    DOB: December 05, 1945, 72 y.o.   MRN: [id number]  HPI   Patient presents to clinic due to cut/abrasion of skin on back of left hand.  States he got this when he was moving boxes around, he and wife recently moved from their condo and are in an apartment.  States the initial clot happened about a week ago, he has been trying to keep clean, but noticed the surrounding skin has become red warm and more tender.  Denies fever or chills.  Denies drainage from the area.  Patient Active Problem List   Diagnosis Date Noted  . Influenza 01/05/2018  . History of colonic polyps 01/07/2016  . Encounter for screening for malignant neoplasm of colon 10/16/2015  . Health care maintenance 04/14/2015  . Chest pressure 10/23/2014  . PVC (premature ventricular contraction) 10/23/2014  . Basal cell carcinoma 04/08/2014  . History of anemia 02/21/2013  . Hypercholesterolemia 02/21/2013   Social History   Tobacco Use  . Smoking status: Never Smoker  . Smokeless tobacco: Never Used  Substance Use Topics  . Alcohol use: Yes    Alcohol/week: 0.0 standard drinks    Comment: rare   Review of Systems  Constitutional: Negative for chills, fatigue and fever.  HENT: Negative for congestion, ear pain, sinus pain and sore throat.   Eyes: Negative.   Respiratory: Negative for cough, shortness of breath and wheezing.   Cardiovascular: Negative for chest pain, palpitations and leg swelling.  Gastrointestinal: Negative for abdominal pain, diarrhea, nausea and vomiting.  Genitourinary: Negative for dysuria, frequency and urgency.  Musculoskeletal: Negative for arthralgias and myalgias.  Skin: +cut on back of hand Neurological: Negative for syncope, light-headedness and headaches.  Psychiatric/Behavioral: The patient is not nervous/anxious.       Objective:   Physical Exam  Constitutional: He is oriented to person, place, and time. No distress.  HENT:    Head: Normocephalic and atraumatic.  Eyes: EOM are normal. No scleral icterus.  Neck: Neck supple. No tracheal deviation present.  Cardiovascular: Normal rate and regular rhythm.  Musculoskeletal: Normal range of motion. He exhibits no edema or deformity.  Neurological: He is alert and oriented to person, place, and time.  Skin: Skin is warm. Capillary refill takes less than 2 seconds. There is erythema.  +skin abrasion on back of left hand, about size of dime. Surrounding skin is very warm to touch and is red.   Psychiatric: He has a normal mood and affect. His behavior is normal.  Nursing note and vitals reviewed.     Vitals:   09/15/18 1520  BP: 118/84  Pulse: 77  Temp: 97.7 F (36.5 C)  SpO2: 94%    Assessment & Plan:    Cellulitis of left upper extremity/skin abrasion - due to surrounding skin redness and warmth around the actual skin abrasion I will treat patient for cellulitis with Keflex twice daily for 7 days.  Advised to wash skin with soap and water, pat dry, can apply a triple antibiotic ointment over the area and cover with Band-Aid.  Advised to monitor for any worsening redness, streaking up the arm, development of fever or chills or puslike drainage from the abrasion.   Keep regularly scheduled follow-up as planned.  Return to clinic sooner if any issues arise.

## 2018-09-16 ENCOUNTER — Encounter: Payer: Self-pay | Admitting: Family Medicine

## 2018-09-19 ENCOUNTER — Ambulatory Visit (INDEPENDENT_AMBULATORY_CARE_PROVIDER_SITE_OTHER): Payer: Medicare Other

## 2018-09-19 DIAGNOSIS — Z23 Encounter for immunization: Secondary | ICD-10-CM

## 2018-09-21 ENCOUNTER — Ambulatory Visit (INDEPENDENT_AMBULATORY_CARE_PROVIDER_SITE_OTHER): Payer: Medicare Other | Admitting: Internal Medicine

## 2018-09-21 ENCOUNTER — Encounter: Payer: Self-pay | Admitting: Internal Medicine

## 2018-09-21 DIAGNOSIS — Z862 Personal history of diseases of the blood and blood-forming organs and certain disorders involving the immune mechanism: Secondary | ICD-10-CM

## 2018-09-21 DIAGNOSIS — E7800 Pure hypercholesterolemia, unspecified: Secondary | ICD-10-CM

## 2018-09-21 DIAGNOSIS — Z0000 Encounter for general adult medical examination without abnormal findings: Secondary | ICD-10-CM

## 2018-09-21 LAB — CBC WITH DIFFERENTIAL/PLATELET
BASOS PCT: 0.8 % (ref 0.0–3.0)
Basophils Absolute: 0 10*3/uL (ref 0.0–0.1)
Eosinophils Absolute: 0.2 10*3/uL (ref 0.0–0.7)
Eosinophils Relative: 5.6 % — ABNORMAL HIGH (ref 0.0–5.0)
HEMATOCRIT: 40.8 % (ref 39.0–52.0)
HEMOGLOBIN: 13.8 g/dL (ref 13.0–17.0)
LYMPHS PCT: 23.9 % (ref 12.0–46.0)
Lymphs Abs: 1 10*3/uL (ref 0.7–4.0)
MCHC: 33.8 g/dL (ref 30.0–36.0)
MCV: 94 fl (ref 78.0–100.0)
MONOS PCT: 10.3 % (ref 3.0–12.0)
Monocytes Absolute: 0.4 10*3/uL (ref 0.1–1.0)
Neutro Abs: 2.5 10*3/uL (ref 1.4–7.7)
Neutrophils Relative %: 59.4 % (ref 43.0–77.0)
Platelets: 210 10*3/uL (ref 150.0–400.0)
RBC: 4.33 Mil/uL (ref 4.22–5.81)
RDW: 13.4 % (ref 11.5–15.5)
WBC: 4.3 10*3/uL (ref 4.0–10.5)

## 2018-09-21 LAB — LIPID PANEL
CHOLESTEROL: 166 mg/dL (ref 0–200)
HDL: 42.2 mg/dL (ref >39.00)
LDL Cholesterol: 98 mg/dL (ref 0–99)
NonHDL: 123.94
TRIGLYCERIDES: 132 mg/dL (ref 0.0–149.0)
Total CHOL/HDL Ratio: 4
VLDL: 26.4 mg/dL (ref 0.0–40.0)

## 2018-09-21 LAB — COMPREHENSIVE METABOLIC PANEL
ALBUMIN: 4.5 g/dL (ref 3.5–5.2)
ALK PHOS: 38 U/L — AB (ref 39–117)
ALT: 13 U/L (ref 0–53)
AST: 17 U/L (ref 0–37)
BUN: 19 mg/dL (ref 6–23)
CALCIUM: 9.3 mg/dL (ref 8.4–10.5)
CO2: 29 mEq/L (ref 19–32)
Chloride: 103 mEq/L (ref 96–112)
Creatinine, Ser: 1.11 mg/dL (ref 0.40–1.50)
GFR: 68.87 mL/min (ref >60.00)
Glucose, Bld: 103 mg/dL — ABNORMAL HIGH (ref 70–99)
POTASSIUM: 4.8 meq/L (ref 3.5–5.1)
SODIUM: 139 meq/L (ref 135–145)
TOTAL PROTEIN: 7 g/dL (ref 6.0–8.3)
Total Bilirubin: 0.6 mg/dL (ref 0.2–1.2)

## 2018-09-21 LAB — TSH: TSH: 3.22 u[IU]/mL (ref 0.35–4.50)

## 2018-09-21 NOTE — Assessment & Plan Note (Signed)
Up to date with colonoscopy.  Check cbc.

## 2018-09-21 NOTE — Assessment & Plan Note (Signed)
Low cholesterol diet and exercise.  Follow lipid panel.   

## 2018-09-21 NOTE — Assessment & Plan Note (Signed)
Physical today 09/21/18.  PSA checked 03/24/2018 - 1.09.  Colonoscopy 01/07/16.  Recommended f/u colonoscopy in 5 years.

## 2018-09-21 NOTE — Progress Notes (Signed)
[name], male   DOB: 1946-02-05, 72 y.o.   MRN: [id number]   Subjective:    [name], male    DOB: 28-Nov-1946, 72 y.o.   MRN: [id number]  HPI  Patient here for his physical exam.  States he is doing well.  Recently moved.  Some increased stress with moving and with his mother-n-law's health issues.  Overall handling things relatively well.  Stays active. No chest pain.  No sob.  No acid reflux.  No abdominal pain.  Bowels moving.  No urine change.  Had flu shot two days ago.     Past Medical History:  Diagnosis Date  . Anemia   . Cancer ([place])    skin  . Hypercholesterolemia    Past Surgical History:  Procedure Laterality Date  . COLONOSCOPY WITH PROPOFOL N/A 01/06/2016   Procedure: COLONOSCOPY WITH PROPOFOL;  Surgeon: Lollie Sails, MD;  Location: [facility] ENDOSCOPY;  Service: Endoscopy;  Laterality: N/A;  . HERNIA REPAIR  1983  . HERNIA REPAIR    . INGUINAL HERNIA REPAIR Right 06/25/2015   Procedure: HERNIA REPAIR INGUINAL ADULT;  Surgeon: Leonie Green, MD;  Location: [facility] ORS;  Service: General;  Laterality: Right;  . plate in rt forearm Right   . Rod placed Rt leg Right   . SKIN CANCER EXCISION  4/15   BCCA-shoulder   Family History  Problem Relation Age of Onset  . Diabetes Mother   . Prostate cancer Neg Hx   . Colon cancer Neg Hx    Social History   Socioeconomic History  . Marital status: Married    Spouse name: Not on file  . Number of children: 2  . Years of education: Not on file  . Highest education level: Not on file  Occupational History  . Not on file  Social Needs  . Financial resource strain: Not on file  . Food insecurity:    Worry: Not on file    Inability: Not on file  . Transportation needs:    Medical: Not on file    Non-medical: Not on file  Tobacco Use  . Smoking status: Never Smoker  . Smokeless tobacco: Never Used  Substance and Sexual Activity  . Alcohol use: Yes    Alcohol/week: 0.0  standard drinks    Comment: rare  . Drug use: No  . Sexual activity: Yes  Lifestyle  . Physical activity:    Days per week: Not on file    Minutes per session: Not on file  . Stress: Not on file  Relationships  . Social connections:    Talks on phone: Not on file    Gets together: Not on file    Attends religious service: Not on file    Active member of club or organization: Not on file    Attends meetings of clubs or organizations: Not on file    Relationship status: Not on file  Other Topics Concern  . Not on file  Social History Narrative  . Not on file    Outpatient Encounter Medications as of 09/21/2018  Medication Sig  . [DISCONTINUED] cephALEXin (KEFLEX) 500 MG capsule Take 1 capsule (500 mg total) by mouth 2 (two) times daily for 7 days.   No facility-administered encounter medications on file as of 09/21/2018.     Review of Systems  Constitutional: Negative for appetite change and unexpected weight change.  HENT: Negative for congestion and sinus pressure.   Eyes: Negative  for pain and visual disturbance.  Respiratory: Negative for cough, chest tightness and shortness of breath.   Cardiovascular: Negative for chest pain, palpitations and leg swelling.  Gastrointestinal: Negative for abdominal pain, diarrhea and nausea.  Genitourinary: Negative for difficulty urinating and dysuria.  Musculoskeletal: Negative for joint swelling and myalgias.  Skin: Negative for color change and rash.  Neurological: Negative for dizziness, light-headedness and headaches.  Hematological: Negative for adenopathy. Does not bruise/bleed easily.  Psychiatric/Behavioral: Negative for agitation and dysphoric mood.       Objective:    Physical Exam  Constitutional: He is oriented to person, place, and time. He appears well-developed and well-nourished. No distress.  HENT:  Head: Normocephalic and atraumatic.  Nose: Nose normal.  Mouth/Throat: Oropharynx is clear and moist. No  oropharyngeal exudate.  Eyes: Conjunctivae are normal. Right eye exhibits no discharge. Left eye exhibits no discharge.  Neck: Neck supple. No thyromegaly present.  Cardiovascular: Normal rate and regular rhythm.  Pulmonary/Chest: Breath sounds normal. No respiratory distress. He has no wheezes.  Abdominal: Soft. Bowel sounds are normal. There is no tenderness.  Genitourinary:  Genitourinary Comments: Not performed.    Musculoskeletal: He exhibits no edema or tenderness.  Lymphadenopathy:    He has no cervical adenopathy.  Neurological: He is alert and oriented to person, place, and time.  Skin: No rash noted. No erythema.  Psychiatric: He has a normal mood and affect. His behavior is normal.    BP 112/74 (BP Location: Left Arm, Patient Position: Sitting, Cuff Size: Normal)   Pulse 68   Temp 97.8 F (36.6 C) (Oral)   Resp 18   Ht 5\' 10"  (1.778 m)   Wt 164 lb (74.4 kg)   SpO2 98%   BMI 23.53 kg/m  Wt Readings from Last 3 Encounters:  09/21/18 164 lb (74.4 kg)  09/15/18 163 lb (73.9 kg)  01/05/18 163 lb (73.9 kg)     Lab Results  Component Value Date   WBC 4.5 09/20/2017   HGB 14.0 09/20/2017   HCT 42.3 09/20/2017   PLT 206.0 09/20/2017   GLUCOSE 89 04/06/2018   CHOL 173 03/24/2018   TRIG 110.0 03/24/2018   HDL 45.60 03/24/2018   LDLCALC 105 (H) 03/24/2018   ALT 13 03/24/2018   AST 17 03/24/2018   NA 140 04/06/2018   K 4.3 04/06/2018   CL 105 04/06/2018   CREATININE 1.01 04/06/2018   BUN 15 04/06/2018   CO2 29 04/06/2018   TSH 2.63 09/20/2017   PSA 1.06 03/24/2018       Assessment & Plan:   Problem List Items Addressed This Visit    Health care maintenance    Physical today 09/21/18.  PSA checked 03/24/2018 - 1.09.  Colonoscopy 01/07/16.  Recommended f/u colonoscopy in 5 years.        History of anemia    Up to date with colonoscopy.  Check cbc.        Relevant Orders   CBC with Differential/Platelet   Hypercholesterolemia    Low cholesterol diet and  exercise.  Follow lipid panel.       Relevant Orders   Comprehensive metabolic panel   Lipid panel   TSH       Einar Pheasant, MD

## 2019-01-30 DIAGNOSIS — Z08 Encounter for follow-up examination after completed treatment for malignant neoplasm: Secondary | ICD-10-CM | POA: Diagnosis not present

## 2019-01-30 DIAGNOSIS — Z85828 Personal history of other malignant neoplasm of skin: Secondary | ICD-10-CM | POA: Diagnosis not present

## 2019-01-30 DIAGNOSIS — D2271 Melanocytic nevi of right lower limb, including hip: Secondary | ICD-10-CM | POA: Diagnosis not present

## 2019-01-30 DIAGNOSIS — L570 Actinic keratosis: Secondary | ICD-10-CM | POA: Diagnosis not present

## 2019-01-30 DIAGNOSIS — L111 Transient acantholytic dermatosis [Grover]: Secondary | ICD-10-CM | POA: Diagnosis not present

## 2019-01-30 DIAGNOSIS — D2261 Melanocytic nevi of right upper limb, including shoulder: Secondary | ICD-10-CM | POA: Diagnosis not present

## 2019-01-30 DIAGNOSIS — D2272 Melanocytic nevi of left lower limb, including hip: Secondary | ICD-10-CM | POA: Diagnosis not present

## 2019-01-30 DIAGNOSIS — D225 Melanocytic nevi of trunk: Secondary | ICD-10-CM | POA: Diagnosis not present

## 2019-01-30 DIAGNOSIS — D2262 Melanocytic nevi of left upper limb, including shoulder: Secondary | ICD-10-CM | POA: Diagnosis not present

## 2019-01-30 DIAGNOSIS — X32XXXA Exposure to sunlight, initial encounter: Secondary | ICD-10-CM | POA: Diagnosis not present

## 2019-05-01 DIAGNOSIS — H25813 Combined forms of age-related cataract, bilateral: Secondary | ICD-10-CM | POA: Diagnosis not present

## 2019-09-08 ENCOUNTER — Other Ambulatory Visit: Payer: Self-pay

## 2019-09-08 ENCOUNTER — Ambulatory Visit (INDEPENDENT_AMBULATORY_CARE_PROVIDER_SITE_OTHER): Payer: Medicare HMO

## 2019-09-08 DIAGNOSIS — Z23 Encounter for immunization: Secondary | ICD-10-CM | POA: Diagnosis not present

## 2019-09-25 ENCOUNTER — Other Ambulatory Visit: Payer: Self-pay

## 2019-09-25 ENCOUNTER — Encounter: Payer: Self-pay | Admitting: Internal Medicine

## 2019-09-25 ENCOUNTER — Ambulatory Visit (INDEPENDENT_AMBULATORY_CARE_PROVIDER_SITE_OTHER): Payer: Medicare HMO | Admitting: Internal Medicine

## 2019-09-25 VITALS — BP 118/72 | HR 69 | Temp 96.3°F | Resp 16 | Ht 70.0 in | Wt 162.2 lb

## 2019-09-25 DIAGNOSIS — Z0000 Encounter for general adult medical examination without abnormal findings: Secondary | ICD-10-CM

## 2019-09-25 DIAGNOSIS — E7800 Pure hypercholesterolemia, unspecified: Secondary | ICD-10-CM

## 2019-09-25 DIAGNOSIS — Z862 Personal history of diseases of the blood and blood-forming organs and certain disorders involving the immune mechanism: Secondary | ICD-10-CM

## 2019-09-25 DIAGNOSIS — Z125 Encounter for screening for malignant neoplasm of prostate: Secondary | ICD-10-CM | POA: Diagnosis not present

## 2019-09-25 DIAGNOSIS — Z86010 Personal history of colonic polyps: Secondary | ICD-10-CM

## 2019-09-25 LAB — COMPREHENSIVE METABOLIC PANEL
ALT: 13 U/L (ref 0–53)
AST: 16 U/L (ref 0–37)
Albumin: 4.5 g/dL (ref 3.5–5.2)
Alkaline Phosphatase: 38 U/L — ABNORMAL LOW (ref 39–117)
BUN: 23 mg/dL (ref 6–23)
CO2: 28 mEq/L (ref 19–32)
Calcium: 9.1 mg/dL (ref 8.4–10.5)
Chloride: 103 mEq/L (ref 96–112)
Creatinine, Ser: 1.18 mg/dL (ref 0.40–1.50)
GFR: 60.22 mL/min (ref >60.00)
Glucose, Bld: 92 mg/dL (ref 70–99)
Potassium: 4 mEq/L (ref 3.5–5.1)
Sodium: 139 mEq/L (ref 135–145)
Total Bilirubin: 0.7 mg/dL (ref 0.2–1.2)
Total Protein: 6.9 g/dL (ref 6.0–8.3)

## 2019-09-25 LAB — LIPID PANEL
Cholesterol: 179 mg/dL (ref 0–200)
HDL: 37.5 mg/dL — ABNORMAL LOW (ref >39.00)
LDL Cholesterol: 102 mg/dL — ABNORMAL HIGH (ref 0–99)
NonHDL: 141.51
Total CHOL/HDL Ratio: 5
Triglycerides: 198 mg/dL — ABNORMAL HIGH (ref 0.0–149.0)
VLDL: 39.6 mg/dL (ref 0.0–40.0)

## 2019-09-25 LAB — CBC WITH DIFFERENTIAL/PLATELET
Basophils Absolute: 0 10*3/uL (ref 0.0–0.1)
Basophils Relative: 0.9 % (ref 0.0–3.0)
Eosinophils Absolute: 0.3 10*3/uL (ref 0.0–0.7)
Eosinophils Relative: 6.1 % — ABNORMAL HIGH (ref 0.0–5.0)
HCT: 41.2 % (ref 39.0–52.0)
Hemoglobin: 13.7 g/dL (ref 13.0–17.0)
Lymphocytes Relative: 30 % (ref 12.0–46.0)
Lymphs Abs: 1.4 10*3/uL (ref 0.7–4.0)
MCHC: 33.3 g/dL (ref 30.0–36.0)
MCV: 95.7 fl (ref 78.0–100.0)
Monocytes Absolute: 0.4 10*3/uL (ref 0.1–1.0)
Monocytes Relative: 9.9 % (ref 3.0–12.0)
Neutro Abs: 2.4 10*3/uL (ref 1.4–7.7)
Neutrophils Relative %: 53.1 % (ref 43.0–77.0)
Platelets: 201 10*3/uL (ref 150.0–400.0)
RBC: 4.3 Mil/uL (ref 4.22–5.81)
RDW: 13.5 % (ref 11.5–15.5)
WBC: 4.5 10*3/uL (ref 4.0–10.5)

## 2019-09-25 LAB — PSA, MEDICARE: PSA: 1.46 ng/ml (ref 0.10–4.00)

## 2019-09-25 LAB — TSH: TSH: 3.18 u[IU]/mL (ref 0.35–4.50)

## 2019-09-25 NOTE — Assessment & Plan Note (Signed)
Physical today.  Colonoscopy 01/2016.  Recommended f/u in 5 years.  Check psa today.

## 2019-09-25 NOTE — Assessment & Plan Note (Signed)
Up to date with colonoscopy.  Check cbc today.

## 2019-09-25 NOTE — Assessment & Plan Note (Signed)
Colonoscopy 01/2016 as outlined in overview.  Recheck 5 years.

## 2019-09-25 NOTE — Progress Notes (Signed)
[name], male   DOB: 27-Jun-1946, 73 y.o.   MRN: [id number]   Subjective:    [name] [facility], male    DOB: August 02, 1946, 73 y.o.   MRN: [id number]  HPI  Patient here for his physical exam.  He reports he is doing well.  Feels good.  Stays active.  No chest pain.  No sob. No acid reflux.  No abdominal pain.  Bowels moving.  Handling stress.  Mother-n-law passed away last week. Doing well.  Has good support.  Overall feels good.  Discussed shingrix.  Had flu shot last week.    Past Medical History:  Diagnosis Date  . Anemia   . Cancer ([place])    skin  . Hypercholesterolemia    Past Surgical History:  Procedure Laterality Date  . COLONOSCOPY WITH PROPOFOL N/A 01/06/2016   Procedure: COLONOSCOPY WITH PROPOFOL;  Surgeon: Lollie Sails, MD;  Location: [facility] ENDOSCOPY;  Service: Endoscopy;  Laterality: N/A;  . HERNIA REPAIR  1983  . HERNIA REPAIR    . INGUINAL HERNIA REPAIR Right 06/25/2015   Procedure: HERNIA REPAIR INGUINAL ADULT;  Surgeon: Leonie Green, MD;  Location: [facility] ORS;  Service: General;  Laterality: Right;  . plate in rt forearm Right   . Rod placed Rt leg Right   . SKIN CANCER EXCISION  4/15   BCCA-shoulder   Family History  Problem Relation Age of Onset  . Diabetes Mother   . Prostate cancer Neg Hx   . Colon cancer Neg Hx    Social History   Socioeconomic History  . Marital status: Married    Spouse name: Not on file  . Number of children: 2  . Years of education: Not on file  . Highest education level: Not on file  Occupational History  . Not on file  Social Needs  . Financial resource strain: Not on file  . Food insecurity    Worry: Not on file    Inability: Not on file  . Transportation needs    Medical: Not on file    Non-medical: Not on file  Tobacco Use  . Smoking status: Never Smoker  . Smokeless tobacco: Never Used  Substance and Sexual Activity  . Alcohol use: Yes    Alcohol/week: 0.0 standard drinks     Comment: rare  . Drug use: No  . Sexual activity: Yes  Lifestyle  . Physical activity    Days per week: Not on file    Minutes per session: Not on file  . Stress: Not on file  Relationships  . Social Herbalist on phone: Not on file    Gets together: Not on file    Attends religious service: Not on file    Active member of club or organization: Not on file    Attends meetings of clubs or organizations: Not on file    Relationship status: Not on file  Other Topics Concern  . Not on file  Social History Narrative  . Not on file    No outpatient encounter medications on file as of 09/25/2019.   No facility-administered encounter medications on file as of 09/25/2019.    Review of Systems  Constitutional: Negative for appetite change and unexpected weight change.  HENT: Negative for congestion and sinus pressure.   Eyes: Negative for pain and visual disturbance.  Respiratory: Negative for cough, chest tightness and shortness of breath.   Cardiovascular: Negative for chest pain, palpitations and  leg swelling.  Gastrointestinal: Negative for abdominal pain, diarrhea, nausea and vomiting.  Genitourinary: Negative for difficulty urinating and dysuria.  Musculoskeletal: Negative for joint swelling and myalgias.  Skin: Negative for color change and rash.  Neurological: Negative for dizziness, light-headedness and headaches.  Hematological: Negative for adenopathy. Does not bruise/bleed easily.  Psychiatric/Behavioral: Negative for agitation and dysphoric mood.       Objective:    Physical Exam Constitutional:      General: He is not in acute distress.    Appearance: Normal appearance. He is well-developed.  HENT:     Head: Normocephalic and atraumatic.     Right Ear: External ear normal.     Left Ear: External ear normal.  Eyes:     General: No scleral icterus.       Right eye: No discharge.        Left eye: No discharge.     Conjunctiva/sclera: Conjunctivae  normal.  Neck:     Musculoskeletal: Neck supple. No muscular tenderness.     Thyroid: No thyromegaly.  Cardiovascular:     Rate and Rhythm: Normal rate and regular rhythm.  Pulmonary:     Effort: No respiratory distress.     Breath sounds: Normal breath sounds. No wheezing.  Abdominal:     General: Bowel sounds are normal.     Palpations: Abdomen is soft.     Tenderness: There is no abdominal tenderness.  Genitourinary:    Comments: Not performed.   Musculoskeletal:        General: No tenderness.  Lymphadenopathy:     Cervical: No cervical adenopathy.  Skin:    Findings: No erythema or rash.  Neurological:     Mental Status: He is alert and oriented to person, place, and time.  Psychiatric:        Mood and Affect: Mood normal.        Behavior: Behavior normal.     BP 118/72   Pulse 69   Temp (!) 96.3 F (35.7 C)   Resp 16   Ht 5\' 10"  (1.778 m)   Wt 162 lb 3.2 oz (73.6 kg)   SpO2 99%   BMI 23.27 kg/m  Wt Readings from Last 3 Encounters:  09/25/19 162 lb 3.2 oz (73.6 kg)  09/21/18 164 lb (74.4 kg)  09/15/18 163 lb (73.9 kg)     Lab Results  Component Value Date   WBC 4.3 09/21/2018   HGB 13.8 09/21/2018   HCT 40.8 09/21/2018   PLT 210.0 09/21/2018   GLUCOSE 103 (H) 09/21/2018   CHOL 166 09/21/2018   TRIG 132.0 09/21/2018   HDL 42.20 09/21/2018   LDLCALC 98 09/21/2018   ALT 13 09/21/2018   AST 17 09/21/2018   NA 139 09/21/2018   K 4.8 09/21/2018   CL 103 09/21/2018   CREATININE 1.11 09/21/2018   BUN 19 09/21/2018   CO2 29 09/21/2018   TSH 3.22 09/21/2018   PSA 1.06 03/24/2018       Assessment & Plan:   Problem List Items Addressed This Visit    Health care maintenance    Physical today.  Colonoscopy 01/2016.  Recommended f/u in 5 years.  Check psa today.        History of anemia    Up to date with colonoscopy.  Check cbc today.       History of colonic polyps    Colonoscopy 01/2016 as outlined in overview.  Recheck 5 years.  Hypercholesterolemia    Low cholesterol diet and exercise.  Follow lipid panel.        Relevant Orders   CBC with Differential/Platelet   Comprehensive metabolic panel   TSH   Lipid panel    Other Visit Diagnoses    Routine general medical examination at a health care facility    -  Primary   Prostate cancer screening       Relevant Orders   PSA, Medicare       Einar Pheasant, MD

## 2019-09-25 NOTE — Assessment & Plan Note (Signed)
Low cholesterol diet and exercise.  Follow lipid panel.   

## 2019-10-16 DIAGNOSIS — S00412A Abrasion of left ear, initial encounter: Secondary | ICD-10-CM | POA: Diagnosis not present

## 2019-10-16 DIAGNOSIS — L299 Pruritus, unspecified: Secondary | ICD-10-CM | POA: Diagnosis not present

## 2019-11-03 DIAGNOSIS — X32XXXA Exposure to sunlight, initial encounter: Secondary | ICD-10-CM | POA: Diagnosis not present

## 2019-11-03 DIAGNOSIS — D2262 Melanocytic nevi of left upper limb, including shoulder: Secondary | ICD-10-CM | POA: Diagnosis not present

## 2019-11-03 DIAGNOSIS — D2261 Melanocytic nevi of right upper limb, including shoulder: Secondary | ICD-10-CM | POA: Diagnosis not present

## 2019-11-03 DIAGNOSIS — L821 Other seborrheic keratosis: Secondary | ICD-10-CM | POA: Diagnosis not present

## 2019-11-03 DIAGNOSIS — Z85828 Personal history of other malignant neoplasm of skin: Secondary | ICD-10-CM | POA: Diagnosis not present

## 2019-11-03 DIAGNOSIS — L570 Actinic keratosis: Secondary | ICD-10-CM | POA: Diagnosis not present

## 2019-11-03 DIAGNOSIS — D2272 Melanocytic nevi of left lower limb, including hip: Secondary | ICD-10-CM | POA: Diagnosis not present

## 2019-12-08 ENCOUNTER — Encounter: Payer: Self-pay | Admitting: Internal Medicine

## 2020-03-04 ENCOUNTER — Telehealth: Payer: Self-pay

## 2020-03-04 ENCOUNTER — Other Ambulatory Visit: Payer: Self-pay

## 2020-03-04 ENCOUNTER — Ambulatory Visit: Payer: Medicare HMO

## 2020-03-04 NOTE — Telephone Encounter (Signed)
Failed to reach patient for scheduled awv. No answer. Left message to call the office back during allotted time or reschedule. Please reschedule as appropriate.

## 2020-03-14 ENCOUNTER — Ambulatory Visit (INDEPENDENT_AMBULATORY_CARE_PROVIDER_SITE_OTHER): Payer: Medicare HMO

## 2020-03-14 VITALS — Ht 70.0 in | Wt 155.0 lb

## 2020-03-14 DIAGNOSIS — Z0000 Encounter for general adult medical examination without abnormal findings: Secondary | ICD-10-CM

## 2020-03-14 NOTE — Patient Instructions (Addendum)
  Mr. Bittenbender , Thank you for taking time to come for your Medicare Wellness Visit. I appreciate your ongoing commitment to your health goals. Please review the following plan we discussed and let me know if I can assist you in the future.   These are the goals we discussed: Goals    . Maintain Healthy Lifestyle     Ride a bicycle on the trail with wife  Stay hydrated  Healthy diet  Stay active       This is a list of the screening recommended for you and due dates:  Health Maintenance  Topic Date Due  . Flu Shot  06/30/2020  . Tetanus Vaccine  07/30/2024  . Colon Cancer Screening  01/05/2026  .  Hepatitis C: One time screening is recommended by Center for Disease Control  (CDC) for  adults born from [birth year] through 1965.   Completed  . Pneumonia vaccines  Completed

## 2020-03-14 NOTE — Progress Notes (Addendum)
Subjective:   Troy Arnold is a 74 y.o. male who presents for Medicare Annual/Subsequent preventive examination.  Review of Systems:  No ROS.  Medicare Wellness Virtual Visit.  Visual/audio telehealth visit, UTA vital signs.   Ht/Wt provided.  See social history for additional risk factors.  Cardiac Risk Factors include: advanced age (>94men, >68 women);male gender     Objective:    Vitals: Ht 5\' 10"  (1.778 m)   Wt 155 lb (70.3 kg)   BMI 22.24 kg/m   Body mass index is 22.24 kg/m.  Advanced Directives 03/14/2020 09/05/2015 06/25/2015 06/10/2015 05/24/2015  Does Patient Have a Medical Advance Directive? Yes Yes Yes Yes No  Type of Paramedic of [place];Living will [place];Living will [place];Living will -  Does patient want to make changes to medical advance directive? No - Patient declined No - Patient declined No - Patient declined - -  Copy of [place] in Chart? No - copy requested Yes - - -  Would patient like information on creating a medical advance directive? - - - - Yes - Scientist, clinical (histocompatibility and immunogenetics) given    Tobacco Social History   Tobacco Use  Smoking Status Never Smoker  Smokeless Tobacco Never Used     Counseling given: Not Answered   Clinical Intake:  Pre-visit preparation completed: Yes        Diabetes: No  How often do you need to have someone help you when you read instructions, pamphlets, or other written materials from your doctor or pharmacy?: 1 - Never  Interpreter Needed?: No     Past Medical History:  Diagnosis Date  . Anemia   . Cancer ([place])    skin  . Hypercholesterolemia    Past Surgical History:  Procedure Laterality Date  . COLONOSCOPY WITH PROPOFOL N/A 01/06/2016   Procedure: COLONOSCOPY WITH PROPOFOL;  Surgeon: Lollie Sails, MD;  Location: [facility] ENDOSCOPY;  Service: Endoscopy;  Laterality: N/A;  . HERNIA  REPAIR  1983  . HERNIA REPAIR    . INGUINAL HERNIA REPAIR Right 06/25/2015   Procedure: HERNIA REPAIR INGUINAL ADULT;  Surgeon: Leonie Green, MD;  Location: [facility] ORS;  Service: General;  Laterality: Right;  . plate in rt forearm Right   . Rod placed Rt leg Right   . SKIN CANCER EXCISION  4/15   BCCA-shoulder   Family History  Problem Relation Age of Onset  . Diabetes Mother   . Diabetes Brother   . Diabetes Brother   . Prostate cancer Neg Hx   . Colon cancer Neg Hx    Social History   Socioeconomic History  . Marital status: Married    Spouse name: Not on file  . Number of children: 2  . Years of education: Not on file  . Highest education level: Not on file  Occupational History  . Not on file  Tobacco Use  . Smoking status: Never Smoker  . Smokeless tobacco: Never Used  Substance and Sexual Activity  . Alcohol use: Yes    Alcohol/week: 0.0 standard drinks    Comment: rare  . Drug use: No  . Sexual activity: Yes  Other Topics Concern  . Not on file  Social History Narrative  . Not on file   Social Determinants of Health   Financial Resource Strain:   . Difficulty of Paying Living Expenses:   Food Insecurity:   . Worried About [employer] of  Food in the Last Year:   . [place] in the Last Year:   Transportation Needs:   . Film/video editor (Medical):   Marland Kitchen Lack of Transportation (Non-Medical):   Physical Activity:   . Days of Exercise per Week:   . Minutes of Exercise per Session:   Stress:   . Feeling of Stress :   Social Connections:   . Frequency of Communication with Friends and Family:   . Frequency of Social Gatherings with Friends and Family:   . Attends Religious Services:   . Active Member of Clubs or Organizations:   . Attends Archivist Meetings:   Marland Kitchen Marital Status:     No outpatient encounter medications on file as of 03/14/2020.   No facility-administered encounter medications on file as of 03/14/2020.     Activities of Daily Living In your present state of health, do you have any difficulty performing the following activities: 03/14/2020  Hearing? N  Vision? N  Difficulty concentrating or making decisions? N  Walking or climbing stairs? N  Dressing or bathing? N  Doing errands, shopping? N  Preparing Food and eating ? N  Using the Toilet? N  In the past six months, have you accidently leaked urine? N  Do you have problems with loss of bowel control? N  Managing your Medications? N  Managing your Finances? N  Housekeeping or managing your Housekeeping? N  Some recent data might be hidden    Patient Care Team: Einar Pheasant, MD as PCP - General (Internal Medicine)   Assessment:   This is a routine wellness examination for Troy Arnold.   Nurse connected with patient 03/14/20 at 11:30 AM EDT by a telephone enabled telemedicine application and verified that I am speaking with the correct person using two identifiers. Patient stated full name and DOB. Patient gave permission to continue with virtual visit. Patient's location was at home and Nurse's location was at [place] office.   Patient is alert and oriented x3. Patient denies difficulty focusing or concentrating. Patient likes to read for brain health.  Health Maintenance Due: -Shingles vaccine- discussed; plans to complete at local pharmacy. Will notify office via mychart or telephone if Rx is requested. Notify office upon completion for record update.  See completed HM at the end of note.   Eye: Visual acuity not assessed. Virtual visit. Followed by their ophthalmologist.  Dental: Visits every 6 months.    Hearing: Demonstrates normal hearing during visit.  Safety:  Patient feels safe at home- yes Patient does have smoke detectors at home- yes Patient does wear sunscreen or protective clothing when in direct sunlight - yes Patient does wear seat belt when in a moving vehicle - yes Patient drives- no Adequate lighting  in walkways free from debris- yes Grab bars and handrails used as appropriate- yes Ambulates with an assistive device- no Cell phone on person when ambulating outside of the home- yes  Social: Alcohol intake - yes      Smoking history- never  Smokers in home? none Illicit drug use? none  Medication: None.  Covid-19: Precautions and sickness symptoms discussed. Wears mask, social distancing, hand hygiene as appropriate.   Activities of Daily Living Patient denies needing assistance with: household chores, feeding themselves, getting from bed to chair, getting to the toilet, bathing/showering, dressing, managing money, or preparing meals.   Discussed the importance of a healthy diet, water intake and the benefits of aerobic exercise. Physical activity- walking daily on treadmill,  30 minutes  Diet:  High protein/Low Carb Water: 48-64 ounces Caffeine: 1-2 cups of coffee  Other Providers Patient Care Team: Einar Pheasant, MD as PCP - General (Internal Medicine)  Exercise Activities and Dietary recommendations Current Exercise Habits: Home exercise routine, Type of exercise: treadmill, Time (Minutes): 30, Frequency (Times/Week): 7, Weekly Exercise (Minutes/Week): 210, Intensity: Moderate  Goals    . Maintain Healthy Lifestyle     Ride a bicycle on the trail with wife  Stay hydrated  Healthy diet  Stay active       Fall Risk Fall Risk  03/14/2020 09/25/2019 09/20/2017 09/17/2016 09/05/2015  Falls in the past year? 0 0 No No No  Follow up Falls evaluation completed Falls evaluation completed - - -   Timed Get Up and Go Performed: no, virtual visit  Depression Screen PHQ 2/9 Scores 03/14/2020 09/25/2019 09/20/2017 09/17/2016  PHQ - 2 Score 0 0 0 0    Cognitive Function MMSE - Mini Mental State Exam 09/05/2015  Orientation to time 5  Orientation to Place 5  Registration 3  Attention/ Calculation 5  Recall 3  Language- name 2 objects 2  Language- repeat 1   Language- follow 3 step command 3  Language- read & follow direction 1  Write a sentence 1  Copy design 1  Total score 30     6CIT Screen 03/14/2020  What Year? 0 points  What month? 0 points  What time? 0 points  Count back from 20 0 points  Months in reverse 0 points    Immunization History  Administered Date(s) Administered  . Hepatitis B 04/08/2010, 05/12/2010, 10/08/2010  . Influenza Split 07/30/2014  . Influenza, High Dose Seasonal PF 09/17/2016, 09/13/2017, 09/19/2018  . Influenza,inj,Quad PF,6+ Mos 09/05/2015, 09/08/2019  . Influenza-Unspecified 09/08/2011  . PFIZER SARS-COV-2 Vaccination 12/25/2019, 01/15/2020  . Pneumococcal Conjugate-13 09/05/2015  . Pneumococcal Polysaccharide-23 09/20/2017  . Td 07/30/2014   Screening Tests Health Maintenance  Topic Date Due  . INFLUENZA VACCINE  06/30/2020  . TETANUS/TDAP  07/30/2024  . COLONOSCOPY  01/05/2026  . Hepatitis C Screening  Completed  . PNA vac Low Risk Adult  Completed       Plan:   Keep all routine maintenance appointments.   Shingles vaccine- discussed; plans to complete at local pharmacy. Will notify office via mychart or telephone if Rx is requested. Notify office upon completion for record update.   Medicare Attestation I have personally reviewed: The patient's medical and social history Their use of alcohol, tobacco or illicit drugs Their current medications and supplements The patient's functional ability including ADLs,fall risks, home safety risks, cognitive, and hearing and visual impairment Diet and physical activities Evidence for depression   I have reviewed and discussed with patient certain preventive protocols, quality metrics, and best practice recommendations.     Varney Biles, LPN  [phone]   Reviewed above information.  Agree with assessment and plan.    Dr Nicki Reaper

## 2020-03-21 DIAGNOSIS — H0014 Chalazion left upper eyelid: Secondary | ICD-10-CM | POA: Diagnosis not present

## 2020-03-26 ENCOUNTER — Telehealth: Payer: Self-pay | Admitting: Internal Medicine

## 2020-03-26 DIAGNOSIS — E7800 Pure hypercholesterolemia, unspecified: Secondary | ICD-10-CM

## 2020-03-26 NOTE — Telephone Encounter (Signed)
Called and schedule pt

## 2020-03-26 NOTE — Telephone Encounter (Signed)
Received my chart message from wife.  Pt needs fasting labs.  Please schedule fasting lab appt.  Thanks.

## 2020-03-28 DIAGNOSIS — H0014 Chalazion left upper eyelid: Secondary | ICD-10-CM | POA: Diagnosis not present

## 2020-04-03 ENCOUNTER — Other Ambulatory Visit: Payer: Self-pay

## 2020-04-03 ENCOUNTER — Other Ambulatory Visit (INDEPENDENT_AMBULATORY_CARE_PROVIDER_SITE_OTHER): Payer: Medicare HMO

## 2020-04-03 DIAGNOSIS — E7800 Pure hypercholesterolemia, unspecified: Secondary | ICD-10-CM | POA: Diagnosis not present

## 2020-04-03 LAB — COMPREHENSIVE METABOLIC PANEL
ALT: 11 U/L (ref 0–53)
AST: 15 U/L (ref 0–37)
Albumin: 4.3 g/dL (ref 3.5–5.2)
Alkaline Phosphatase: 37 U/L — ABNORMAL LOW (ref 39–117)
BUN: 18 mg/dL (ref 6–23)
CO2: 31 mEq/L (ref 19–32)
Calcium: 9.2 mg/dL (ref 8.4–10.5)
Chloride: 105 mEq/L (ref 96–112)
Creatinine, Ser: 1.08 mg/dL (ref 0.40–1.50)
GFR: 66.6 mL/min (ref >60.00)
Glucose, Bld: 98 mg/dL (ref 70–99)
Potassium: 4.6 mEq/L (ref 3.5–5.1)
Sodium: 141 mEq/L (ref 135–145)
Total Bilirubin: 0.8 mg/dL (ref 0.2–1.2)
Total Protein: 6.6 g/dL (ref 6.0–8.3)

## 2020-04-03 LAB — LIPID PANEL
Cholesterol: 198 mg/dL (ref 0–200)
HDL: 38.4 mg/dL — ABNORMAL LOW (ref >39.00)
LDL Cholesterol: 131 mg/dL — ABNORMAL HIGH (ref 0–99)
NonHDL: 159.41
Total CHOL/HDL Ratio: 5
Triglycerides: 142 mg/dL (ref 0.0–149.0)
VLDL: 28.4 mg/dL (ref 0.0–40.0)

## 2020-04-28 ENCOUNTER — Telehealth: Payer: Self-pay | Admitting: Internal Medicine

## 2020-04-28 NOTE — Telephone Encounter (Signed)
Please send copy of [facility] Lipid diet.  Wife stated  - per last phone conservation - was to be mailed to pt.  Never received. Please mail.  Thanks.

## 2020-04-30 NOTE — Telephone Encounter (Signed)
Mailed to patient

## 2020-08-06 DIAGNOSIS — D2262 Melanocytic nevi of left upper limb, including shoulder: Secondary | ICD-10-CM | POA: Diagnosis not present

## 2020-08-06 DIAGNOSIS — D225 Melanocytic nevi of trunk: Secondary | ICD-10-CM | POA: Diagnosis not present

## 2020-08-06 DIAGNOSIS — D2271 Melanocytic nevi of right lower limb, including hip: Secondary | ICD-10-CM | POA: Diagnosis not present

## 2020-08-06 DIAGNOSIS — Z85828 Personal history of other malignant neoplasm of skin: Secondary | ICD-10-CM | POA: Diagnosis not present

## 2020-08-06 DIAGNOSIS — D2261 Melanocytic nevi of right upper limb, including shoulder: Secondary | ICD-10-CM | POA: Diagnosis not present

## 2020-08-06 DIAGNOSIS — X32XXXA Exposure to sunlight, initial encounter: Secondary | ICD-10-CM | POA: Diagnosis not present

## 2020-08-06 DIAGNOSIS — L570 Actinic keratosis: Secondary | ICD-10-CM | POA: Diagnosis not present

## 2020-08-27 ENCOUNTER — Telehealth: Payer: Self-pay | Admitting: Internal Medicine

## 2020-08-27 NOTE — Telephone Encounter (Signed)
Pt's wife would like to know if they get the shingles vaccine from pharmacy do they need a prescription?

## 2020-08-28 NOTE — Telephone Encounter (Signed)
Pt aware they do not need a prescription

## 2020-09-19 DIAGNOSIS — R69 Illness, unspecified: Secondary | ICD-10-CM | POA: Diagnosis not present

## 2020-09-26 ENCOUNTER — Encounter: Payer: Self-pay | Admitting: Internal Medicine

## 2020-09-26 ENCOUNTER — Ambulatory Visit (INDEPENDENT_AMBULATORY_CARE_PROVIDER_SITE_OTHER): Payer: Medicare HMO | Admitting: Internal Medicine

## 2020-09-26 ENCOUNTER — Other Ambulatory Visit: Payer: Self-pay

## 2020-09-26 VITALS — BP 120/70 | HR 78 | Temp 97.6°F | Resp 16 | Ht 70.0 in | Wt 162.6 lb

## 2020-09-26 DIAGNOSIS — E7800 Pure hypercholesterolemia, unspecified: Secondary | ICD-10-CM | POA: Diagnosis not present

## 2020-09-26 DIAGNOSIS — Z23 Encounter for immunization: Secondary | ICD-10-CM | POA: Diagnosis not present

## 2020-09-26 DIAGNOSIS — Z125 Encounter for screening for malignant neoplasm of prostate: Secondary | ICD-10-CM

## 2020-09-26 DIAGNOSIS — Z0000 Encounter for general adult medical examination without abnormal findings: Secondary | ICD-10-CM

## 2020-09-26 LAB — CBC WITH DIFFERENTIAL/PLATELET
Basophils Absolute: 0 10*3/uL (ref 0.0–0.1)
Basophils Relative: 0.7 % (ref 0.0–3.0)
Eosinophils Absolute: 0.2 10*3/uL (ref 0.0–0.7)
Eosinophils Relative: 4.5 % (ref 0.0–5.0)
HCT: 40.9 % (ref 39.0–52.0)
Hemoglobin: 13.8 g/dL (ref 13.0–17.0)
Lymphocytes Relative: 22.8 % (ref 12.0–46.0)
Lymphs Abs: 1.3 10*3/uL (ref 0.7–4.0)
MCHC: 33.8 g/dL (ref 30.0–36.0)
MCV: 93.1 fl (ref 78.0–100.0)
Monocytes Absolute: 0.5 10*3/uL (ref 0.1–1.0)
Monocytes Relative: 9.2 % (ref 3.0–12.0)
Neutro Abs: 3.4 10*3/uL (ref 1.4–7.7)
Neutrophils Relative %: 62.8 % (ref 43.0–77.0)
Platelets: 197 10*3/uL (ref 150.0–400.0)
RBC: 4.4 Mil/uL (ref 4.22–5.81)
RDW: 13.6 % (ref 11.5–15.5)
WBC: 5.5 10*3/uL (ref 4.0–10.5)

## 2020-09-26 LAB — LIPID PANEL
Cholesterol: 195 mg/dL (ref 0–200)
HDL: 40.4 mg/dL (ref >39.00)
LDL Cholesterol: 122 mg/dL — ABNORMAL HIGH (ref 0–99)
NonHDL: 154.99
Total CHOL/HDL Ratio: 5
Triglycerides: 164 mg/dL — ABNORMAL HIGH (ref 0.0–149.0)
VLDL: 32.8 mg/dL (ref 0.0–40.0)

## 2020-09-26 LAB — COMPREHENSIVE METABOLIC PANEL
ALT: 17 U/L (ref 0–53)
AST: 18 U/L (ref 0–37)
Albumin: 4.5 g/dL (ref 3.5–5.2)
Alkaline Phosphatase: 39 U/L (ref 39–117)
BUN: 17 mg/dL (ref 6–23)
CO2: 30 mEq/L (ref 19–32)
Calcium: 9.3 mg/dL (ref 8.4–10.5)
Chloride: 103 mEq/L (ref 96–112)
Creatinine, Ser: 1.15 mg/dL (ref 0.40–1.50)
GFR: 62.15 mL/min (ref >60.00)
Glucose, Bld: 92 mg/dL (ref 70–99)
Potassium: 4.2 mEq/L (ref 3.5–5.1)
Sodium: 140 mEq/L (ref 135–145)
Total Bilirubin: 0.7 mg/dL (ref 0.2–1.2)
Total Protein: 6.7 g/dL (ref 6.0–8.3)

## 2020-09-26 LAB — PSA, MEDICARE: PSA: 1.03 ng/ml (ref 0.10–4.00)

## 2020-09-26 LAB — TSH: TSH: 3.27 u[IU]/mL (ref 0.35–4.50)

## 2020-09-26 NOTE — Progress Notes (Signed)
[name], male   DOB: Aug 26, 1946, 74 y.o.   MRN: [id number]   Subjective:    [name], male    DOB: 1946-07-09, 74 y.o.   MRN: [id number]  HPI This visit occurred during the SARS-CoV-2 public health emergency.  Safety protocols were in place, including screening questions prior to the visit, additional usage of staff PPE, and extensive cleaning of exam room while observing appropriate contact time as indicated for disinfecting solutions.  Patient here for a scheduled follow up. He is doing well.  Feels good.  Staying active.  No chest pain or sob.  No acid reflux reported.  No abdominal pain or bowel change.  Walks on treadmill regularly.  Pollen issues at times, but not a significant issue for him now.  Overall feels good.     Past Medical History:  Diagnosis Date  . Anemia   . Cancer ([place])    skin  . Hypercholesterolemia    Past Surgical History:  Procedure Laterality Date  . COLONOSCOPY WITH PROPOFOL N/A 01/06/2016   Procedure: COLONOSCOPY WITH PROPOFOL;  Surgeon: Lollie Sails, MD;  Location: [facility] ENDOSCOPY;  Service: Endoscopy;  Laterality: N/A;  . HERNIA REPAIR  1983  . HERNIA REPAIR    . INGUINAL HERNIA REPAIR Right 06/25/2015   Procedure: HERNIA REPAIR INGUINAL ADULT;  Surgeon: Leonie Green, MD;  Location: [facility] ORS;  Service: General;  Laterality: Right;  . plate in rt forearm Right   . Rod placed Rt leg Right   . SKIN CANCER EXCISION  4/15   BCCA-shoulder   Family History  Problem Relation Age of Onset  . Diabetes Mother   . Diabetes Brother   . Diabetes Brother   . Prostate cancer Neg Hx   . Colon cancer Neg Hx    Social History   Socioeconomic History  . Marital status: Married    Spouse name: Not on file  . Number of children: 2  . Years of education: Not on file  . Highest education level: Not on file  Occupational History  . Not on file  Tobacco Use  . Smoking status: Never Smoker  . Smokeless  tobacco: Never Used  Substance and Sexual Activity  . Alcohol use: Yes    Alcohol/week: 0.0 standard drinks    Comment: rare  . Drug use: No  . Sexual activity: Yes  Other Topics Concern  . Not on file  Social History Narrative  . Not on file   Social Determinants of Health   Financial Resource Strain:   . Difficulty of Paying Living Expenses: Not on file  Food Insecurity:   . Worried About Charity fundraiser in the Last Year: Not on file  . Ran Out of Food in the Last Year: Not on file  Transportation Needs:   . Lack of Transportation (Medical): Not on file  . Lack of Transportation (Non-Medical): Not on file  Physical Activity:   . Days of Exercise per Week: Not on file  . Minutes of Exercise per Session: Not on file  Stress:   . Feeling of Stress : Not on file  Social Connections:   . Frequency of Communication with Friends and Family: Not on file  . Frequency of Social Gatherings with Friends and Family: Not on file  . Attends Religious Services: Not on file  . Active Member of Clubs or Organizations: Not on file  . Attends Archivist Meetings: Not on file  .  Marital Status: Not on file    No outpatient encounter medications on file as of 09/26/2020.   No facility-administered encounter medications on file as of 09/26/2020.    Review of Systems  Constitutional: Negative for appetite change and unexpected weight change.  HENT: Negative for congestion, sinus pressure and sore throat.   Eyes: Negative for pain and visual disturbance.  Respiratory: Negative for cough, chest tightness and shortness of breath.   Cardiovascular: Negative for chest pain, palpitations and leg swelling.  Gastrointestinal: Negative for abdominal pain, diarrhea, nausea and vomiting.  Genitourinary: Negative for difficulty urinating and dysuria.  Musculoskeletal: Negative for back pain and joint swelling.  Skin: Negative for color change and rash.  Neurological: Negative for  dizziness, light-headedness and headaches.  Hematological: Negative for adenopathy. Does not bruise/bleed easily.  Psychiatric/Behavioral: Negative for agitation and dysphoric mood.       Objective:    Physical Exam Vitals reviewed.  Constitutional:      General: He is not in acute distress.    Appearance: Normal appearance. He is well-developed.  HENT:     Head: Normocephalic and atraumatic.     Right Ear: External ear normal.     Left Ear: External ear normal.  Eyes:     General:        Right eye: No discharge.        Left eye: No discharge.     Conjunctiva/sclera: Conjunctivae normal.  Neck:     Thyroid: No thyromegaly.  Cardiovascular:     Rate and Rhythm: Normal rate and regular rhythm.  Pulmonary:     Effort: No respiratory distress.     Breath sounds: Normal breath sounds. No wheezing.  Abdominal:     General: Bowel sounds are normal.     Palpations: Abdomen is soft.     Tenderness: There is no abdominal tenderness.  Genitourinary:    Comments: Not performed.  Musculoskeletal:        General: No swelling or tenderness.     Cervical back: Neck supple. No tenderness.  Lymphadenopathy:     Cervical: No cervical adenopathy.  Skin:    Findings: No erythema or rash.  Neurological:     Mental Status: He is alert and oriented to person, place, and time.  Psychiatric:        Mood and Affect: Mood normal.        Behavior: Behavior normal.     BP 120/70   Pulse 78   Temp 97.6 F (36.4 C) (Oral)   Resp 16   Ht 5\' 10"  (1.778 m)   Wt 162 lb 9.6 oz (73.8 kg)   SpO2 98%   BMI 23.33 kg/m  Wt Readings from Last 3 Encounters:  09/26/20 162 lb 9.6 oz (73.8 kg)  03/14/20 155 lb (70.3 kg)  09/25/19 162 lb 3.2 oz (73.6 kg)     Lab Results  Component Value Date   WBC 5.5 09/26/2020   HGB 13.8 09/26/2020   HCT 40.9 09/26/2020   PLT 197.0 09/26/2020   GLUCOSE 92 09/26/2020   CHOL 195 09/26/2020   TRIG 164.0 (H) 09/26/2020   HDL 40.40 09/26/2020   LDLCALC 122  (H) 09/26/2020   ALT 17 09/26/2020   AST 18 09/26/2020   NA 140 09/26/2020   K 4.2 09/26/2020   CL 103 09/26/2020   CREATININE 1.15 09/26/2020   BUN 17 09/26/2020   CO2 30 09/26/2020   TSH 3.27 09/26/2020   PSA 1.03 09/26/2020  Assessment & Plan:   Problem List Items Addressed This Visit    Hypercholesterolemia    Low cholesterol diet and exercise.  Have discussed calculated cholesterol risk.  Discussed again today.  Will recheck lipid panel today.  States will agree to start statin medication if needed.  Low cholesterol diet and exercise.        Relevant Orders   CBC with Differential/Platelet (Completed)   Comprehensive metabolic panel (Completed)   Lipid panel (Completed)   TSH (Completed)   Health care maintenance    Physical today 09/26/20.  Check psa today.  Colonoscopy 01/2016.  Recommended f/u in 5 years.         Other Visit Diagnoses    Routine general medical examination at a health care facility    -  Primary   Prostate cancer screening       Relevant Orders   PSA, Medicare (Completed)   Need for immunization against influenza       Relevant Orders   Flu Vaccine QUAD High Dose(Fluad) (Completed)       Einar Pheasant, MD

## 2020-09-26 NOTE — Assessment & Plan Note (Signed)
Physical today 09/26/20.  Check psa today.  Colonoscopy 01/2016.  Recommended f/u in 5 years.

## 2020-09-27 ENCOUNTER — Other Ambulatory Visit: Payer: Self-pay | Admitting: Internal Medicine

## 2020-09-27 DIAGNOSIS — E7800 Pure hypercholesterolemia, unspecified: Secondary | ICD-10-CM

## 2020-09-27 NOTE — Progress Notes (Signed)
Order placed for f/u lab.   

## 2020-09-28 ENCOUNTER — Other Ambulatory Visit: Payer: Self-pay | Admitting: Internal Medicine

## 2020-09-28 DIAGNOSIS — E7800 Pure hypercholesterolemia, unspecified: Secondary | ICD-10-CM

## 2020-09-28 MED ORDER — ROSUVASTATIN CALCIUM 10 MG PO TABS: 10.0000 mg | ORAL_TABLET | Freq: Every day | ORAL | 2 refills | Status: DC

## 2020-09-28 NOTE — Progress Notes (Signed)
Order placed for f/u liver panel.  rx sent in for crestor 10mg .

## 2020-10-06 ENCOUNTER — Encounter: Payer: Self-pay | Admitting: Internal Medicine

## 2020-10-06 NOTE — Assessment & Plan Note (Signed)
Low cholesterol diet and exercise.  Have discussed calculated cholesterol risk.  Discussed again today.  Will recheck lipid panel today.  States will agree to start statin medication if needed.  Low cholesterol diet and exercise.

## 2020-11-11 ENCOUNTER — Other Ambulatory Visit: Payer: Self-pay

## 2020-11-11 ENCOUNTER — Other Ambulatory Visit (INDEPENDENT_AMBULATORY_CARE_PROVIDER_SITE_OTHER): Payer: Medicare HMO

## 2020-11-11 DIAGNOSIS — E7800 Pure hypercholesterolemia, unspecified: Secondary | ICD-10-CM | POA: Diagnosis not present

## 2020-11-11 LAB — HEPATIC FUNCTION PANEL
ALT: 11 U/L (ref 0–53)
AST: 15 U/L (ref 0–37)
Albumin: 4.4 g/dL (ref 3.5–5.2)
Alkaline Phosphatase: 36 U/L — ABNORMAL LOW (ref 39–117)
Bilirubin, Direct: 0.1 mg/dL (ref 0.0–0.3)
Total Bilirubin: 0.7 mg/dL (ref 0.2–1.2)
Total Protein: 6.9 g/dL (ref 6.0–8.3)

## 2020-11-12 ENCOUNTER — Encounter: Payer: Self-pay | Admitting: Internal Medicine

## 2020-12-10 DIAGNOSIS — H25813 Combined forms of age-related cataract, bilateral: Secondary | ICD-10-CM | POA: Diagnosis not present

## 2021-01-10 DIAGNOSIS — H40013 Open angle with borderline findings, low risk, bilateral: Secondary | ICD-10-CM | POA: Diagnosis not present

## 2021-01-10 DIAGNOSIS — H2512 Age-related nuclear cataract, left eye: Secondary | ICD-10-CM | POA: Diagnosis not present

## 2021-01-10 DIAGNOSIS — H18413 Arcus senilis, bilateral: Secondary | ICD-10-CM | POA: Diagnosis not present

## 2021-01-10 DIAGNOSIS — H25043 Posterior subcapsular polar age-related cataract, bilateral: Secondary | ICD-10-CM | POA: Diagnosis not present

## 2021-01-10 DIAGNOSIS — H2513 Age-related nuclear cataract, bilateral: Secondary | ICD-10-CM | POA: Diagnosis not present

## 2021-01-31 DIAGNOSIS — H2512 Age-related nuclear cataract, left eye: Secondary | ICD-10-CM | POA: Diagnosis not present

## 2021-01-31 DIAGNOSIS — H2511 Age-related nuclear cataract, right eye: Secondary | ICD-10-CM | POA: Diagnosis not present

## 2021-02-28 DIAGNOSIS — H2511 Age-related nuclear cataract, right eye: Secondary | ICD-10-CM | POA: Diagnosis not present

## 2021-03-18 ENCOUNTER — Ambulatory Visit: Payer: Medicare HMO

## 2021-04-07 DIAGNOSIS — D2262 Melanocytic nevi of left upper limb, including shoulder: Secondary | ICD-10-CM | POA: Diagnosis not present

## 2021-04-07 DIAGNOSIS — D485 Neoplasm of uncertain behavior of skin: Secondary | ICD-10-CM | POA: Diagnosis not present

## 2021-04-07 DIAGNOSIS — Z85828 Personal history of other malignant neoplasm of skin: Secondary | ICD-10-CM | POA: Diagnosis not present

## 2021-04-07 DIAGNOSIS — D2261 Melanocytic nevi of right upper limb, including shoulder: Secondary | ICD-10-CM | POA: Diagnosis not present

## 2021-04-07 DIAGNOSIS — L570 Actinic keratosis: Secondary | ICD-10-CM | POA: Diagnosis not present

## 2021-04-07 DIAGNOSIS — C44612 Basal cell carcinoma of skin of right upper limb, including shoulder: Secondary | ICD-10-CM | POA: Diagnosis not present

## 2021-04-07 DIAGNOSIS — X32XXXA Exposure to sunlight, initial encounter: Secondary | ICD-10-CM | POA: Diagnosis not present

## 2021-04-07 DIAGNOSIS — D2272 Melanocytic nevi of left lower limb, including hip: Secondary | ICD-10-CM | POA: Diagnosis not present

## 2021-04-17 DIAGNOSIS — H0014 Chalazion left upper eyelid: Secondary | ICD-10-CM | POA: Diagnosis not present

## 2021-04-22 ENCOUNTER — Ambulatory Visit (INDEPENDENT_AMBULATORY_CARE_PROVIDER_SITE_OTHER): Payer: Medicare HMO

## 2021-04-22 ENCOUNTER — Other Ambulatory Visit: Payer: Self-pay

## 2021-04-22 VITALS — BP 120/70 | HR 78 | Temp 97.1°F | Resp 14 | Ht 70.0 in | Wt 165.2 lb

## 2021-04-22 DIAGNOSIS — Z0000 Encounter for general adult medical examination without abnormal findings: Secondary | ICD-10-CM | POA: Diagnosis not present

## 2021-04-22 NOTE — Progress Notes (Signed)
Subjective:   Troy Arnold is a 75 y.o. male who presents for Medicare Annual/Subsequent preventive examination.  Review of Systems    No ROS.  Medicare Wellness  Cardiac Risk Factors include: advanced age (>25men, >55 women);male gender;hypertension     Objective:    Today's Vitals   04/22/21 1544  BP: 120/70  Pulse: 78  Resp: 14  Temp: (!) 97.1 F (36.2 C)  SpO2: 99%  Weight: 165 lb 3.2 oz (74.9 kg)  Height: 5\' 10"  (1.778 m)   Body mass index is 23.7 kg/m.  Advanced Directives 04/22/2021 03/14/2020 09/05/2015 06/25/2015 06/10/2015 05/24/2015  Does Patient Have a Medical Advance Directive? Yes Yes Yes Yes Yes No  Type of Paramedic of [place];Living will Healthcare Power of [place];Living will [place];Living will [place];Living will -  Does patient want to make changes to medical advance directive? No - Patient declined No - Patient declined No - Patient declined No - Patient declined - -  Copy of [place] in Chart? No - copy requested No - copy requested Yes - - -  Would patient like information on creating a medical advance directive? - - - - - Yes - Educational materials given    Current Medications (verified) Outpatient Encounter Medications as of 04/22/2021  Medication Sig  . rosuvastatin (CRESTOR) 10 MG tablet Take 1 tablet (10 mg total) by mouth daily.   No facility-administered encounter medications on file as of 04/22/2021.    Allergies (verified) Patient has no known allergies.   History: Past Medical History:  Diagnosis Date  . Anemia   . Cancer ([place])    skin  . Hypercholesterolemia    Past Surgical History:  Procedure Laterality Date  . COLONOSCOPY WITH PROPOFOL N/A 01/06/2016   Procedure: COLONOSCOPY WITH PROPOFOL;  Surgeon: Lollie Sails, MD;  Location[facility] ENDOSCOPY;  Service: Endoscopy;  Laterality: N/A;  . HERNIA REPAIR   1983  . HERNIA REPAIR    . INGUINAL HERNIA REPAIR Right 06/25/2015   Procedure: HERNIA REPAIR INGUINAL ADULT;  Surgeon: Leonie Green, MD;  Location: [facility] ORS;  Service: General;  Laterality: Right;  . plate in rt forearm Right   . Rod placed Rt leg Right   . SKIN CANCER EXCISION  4/15   BCCA-shoulder   Family History  Problem Relation Age of Onset  . Diabetes Mother   . Diabetes Brother   . Diabetes Brother   . Prostate cancer Neg Hx   . Colon cancer Neg Hx    Social History   Socioeconomic History  . Marital status: Married    Spouse name: Not on file  . Number of children: 2  . Years of education: Not on file  . Highest education level: Not on file  Occupational History  . Not on file  Tobacco Use  . Smoking status: Never Smoker  . Smokeless tobacco: Never Used  Substance and Sexual Activity  . Alcohol use: Yes    Alcohol/week: 0.0 standard drinks    Comment: rare  . Drug use: No  . Sexual activity: Yes  Other Topics Concern  . Not on file  Social History Narrative  . Not on file   Social Determinants of Health   Financial Resource Strain: Low Risk   . Difficulty of Paying Living Expenses: Not hard at all  Food Insecurity: No Food Insecurity  . Worried About Charity fundraiser in  the Last Year: Never true  . Ran Out of Food in the Last Year: Never true  Transportation Needs: No Transportation Needs  . Lack of Transportation (Medical): No  . Lack of Transportation (Non-Medical): No  Physical Activity: Sufficiently Active  . Days of Exercise per Week: 3 days  . Minutes of Exercise per Session: 60 min  Stress: No Stress Concern Present  . Feeling of Stress : Not at all  Social Connections: Unknown  . Frequency of Communication with Friends and Family: More than three times a week  . Frequency of Social Gatherings with Friends and Family: More than three times a week  . Attends Religious Services: Not on file  . Active Member of Clubs or Organizations:  Not on file  . Attends Archivist Meetings: Not on file  . Marital Status: Married    Tobacco Counseling Counseling given: Not Answered   Clinical Intake:  Pre-visit preparation completed: Yes        Diabetes: No  How often do you need to have someone help you when you read instructions, pamphlets, or other written materials from your doctor or pharmacy?: 1 - Never  Interpreter Needed?: No      Activities of Daily Living In your present state of health, do you have any difficulty performing the following activities: 04/22/2021  Hearing? N  Vision? N  Difficulty concentrating or making decisions? N  Walking or climbing stairs? N  Dressing or bathing? N  Doing errands, shopping? N  Preparing Food and eating ? N  Using the Toilet? N  In the past six months, have you accidently leaked urine? N  Do you have problems with loss of bowel control? N  Managing your Medications? N  Managing your Finances? N  Housekeeping or managing your Housekeeping? N  Some recent data might be hidden    Patient Care Team: Einar Pheasant, MD as PCP - General (Internal Medicine)  Indicate any recent Medical Services you may have received from other than [facility] providers in the past year (date may be approximate).     Assessment:   This is a routine wellness examination for Troy Arnold.  Hearing/Vision screen  Hearing Screening   125Hz  250Hz  500Hz  1000Hz  2000Hz  3000Hz  4000Hz  6000Hz  8000Hz   Right ear:           Left ear:           Comments: Patient is able to hear conversational tones without difficulty.  No issues reported.  Vision Screening Comments: Followed by Dr. Gloriann Loan  Wears corrective lenses  Visual acuity not assessed, virtual visit. They have seen their ophthalmologist.   Dietary issues and exercise activities discussed: Current Exercise Habits: Home exercise routine, Type of exercise: treadmill;strength training/weights, Time (Minutes): 30, Frequency (Times/Week): 3,  Weekly Exercise (Minutes/Week): 90, Intensity: Mild  Healthy diet Good water intake  Goals Addressed              This Visit's Progress     Patient Stated   .  Maintain Healthy Lifestyle (pt-stated)        Stay hydrated  Healthy diet  Stay active      Depression Screen PHQ 2/9 Scores 04/22/2021 03/14/2020 09/25/2019 09/20/2017 09/17/2016 09/05/2015 04/08/2015  PHQ - 2 Score 0 0 0 0 0 0 0    Fall Risk Fall Risk  04/22/2021 03/14/2020 09/25/2019 09/20/2017 09/17/2016  Falls in the past year? 0 0 0 No No  Number falls in past yr: 0 - - - -  Injury with Fall? 0 - - - -  Follow up Falls evaluation completed Falls evaluation completed Falls evaluation completed - -    FALL RISK PREVENTION PERTAINING TO THE HOME: Handrails in use while climbing stairs? Yes Home free of loose throw rugs in walkways, pet beds, electrical cords, etc? Yes  Adequate lighting in your home to reduce risk of falls? Yes   ASSISTIVE DEVICES UTILIZED TO PREVENT FALLS: Life alert? No  Use of a cane, walker or w/c? No   TIMED UP AND GO: Was the test performed? Yes .  Length of time to ambulate 10 feet: 10 sec.   Gait steady and fast without use of assistive device  Cognitive Function: Patient is alert and oriented x3.  Denies difficulty focusing, making decisions, memory loss.  Enjoys reading for brain health stimulation. MMSE/6CIT deferred. Normal by direct communication/observation.   MMSE - Mini Mental State Exam 09/05/2015  Orientation to time 5  Orientation to Place 5  Registration 3  Attention/ Calculation 5  Recall 3  Language- name 2 objects 2  Language- repeat 1  Language- follow 3 step command 3  Language- read & follow direction 1  Write a sentence 1  Copy design 1  Total score 30     6CIT Screen 03/14/2020  What Year? 0 points  What month? 0 points  What time? 0 points  Count back from 20 0 points  Months in reverse 0 points    Immunizations Immunization History   Administered Date(s) Administered  . Fluad Quad(high Dose 65+) 09/26/2020  . Hepatitis B 04/08/2010, 05/12/2010, 10/08/2010  . Influenza Split 07/30/2014  . Influenza, High Dose Seasonal PF 09/17/2016, 09/13/2017, 09/19/2018  . Influenza,inj,Quad PF,6+ Mos 09/05/2015, 09/08/2019  . Influenza-Unspecified 09/08/2011, 09/26/2020  . PFIZER(Purple Top)SARS-COV-2 Vaccination 12/25/2019, 01/15/2020  . Pneumococcal Conjugate-13 09/05/2015  . Pneumococcal Polysaccharide-23 09/20/2017  . Td 07/30/2014  . Zoster Recombinat (Shingrix) 01/09/2020   Health Maintenance Health Maintenance  Topic Date Due  . COVID-19 Vaccine (3 - Pfizer risk 4-dose series) 02/12/2020  . INFLUENZA VACCINE  06/30/2021  . TETANUS/TDAP  07/30/2024  . Hepatitis C Screening  Completed  . PNA vac Low Risk Adult  Completed  . HPV VACCINES  Aged Out   Lung Cancer Screening: (Low Dose CT Chest recommended if Age 36-80 years, 30 pack-year currently smoking OR have quit w/in 15years.) does not qualify.   Vision Screening: Recommended annual ophthalmology exams for early detection of glaucoma and other disorders of the eye. Is the patient up to date with their annual eye exam?  Yes   Dental Screening: Recommended annual dental exams for proper oral hygiene. Visits every 6 months.   Community Resource Referral / Chronic Care Management: CRR required this visit?  No   CCM required this visit?  No      Plan:   Keep all routine maintenance appointments.   I have personally reviewed and noted the following in the patient's chart:   . Medical and social history . Use of alcohol, tobacco or illicit drugs  . Current medications and supplements including opioid prescriptions. Patient is not currently taking opioid prescriptions. . Functional ability and status . Nutritional status . Physical activity . Advanced directives . List of other physicians . Hospitalizations, surgeries, and ER visits in previous 12  months . Vitals . Screenings to include cognitive, depression, and falls . Referrals and appointments  In addition, I have reviewed and discussed with patient certain preventive protocols, quality metrics, and best practice  recommendations. A written personalized care plan for preventive services as well as general preventive health recommendations were provided to patient.     Varney Biles, LPN   [id number]

## 2021-04-22 NOTE — Patient Instructions (Addendum)
[name] , Thank you for taking time to come for your Medicare Wellness Visit. I appreciate your ongoing commitment to your health goals. Please review the following plan we discussed and let me know if I can assist you in the future.   These are the goals we discussed: Goals      Patient Stated   .  Maintain Healthy Lifestyle (pt-stated)      Stay hydrated  Healthy diet  Stay active       This is a list of the screening recommended for you and due dates:  Health Maintenance  Topic Date Due  . COVID-19 Vaccine (3 - Pfizer risk 4-dose series) 02/12/2020  . Flu Shot  06/30/2021  . Tetanus Vaccine  07/30/2024  . Hepatitis C Screening: USPSTF Recommendation to screen - Ages 9-79 yo.  Completed  . Pneumonia vaccines  Completed  . HPV Vaccine  Aged Out    Immunizations Immunization History  Administered Date(s) Administered  . Fluad Quad(high Dose 65+) 09/26/2020  . Hepatitis B 04/08/2010, 05/12/2010, 10/08/2010  . Influenza Split 07/30/2014  . Influenza, High Dose Seasonal PF 09/17/2016, 09/13/2017, 09/19/2018  . Influenza,inj,Quad PF,6+ Mos 09/05/2015, 09/08/2019  . Influenza-Unspecified 09/08/2011, 09/26/2020  . PFIZER(Purple Top)SARS-COV-2 Vaccination 12/25/2019, 01/15/2020  . Pneumococcal Conjugate-13 09/05/2015  . Pneumococcal Polysaccharide-23 09/20/2017  . Td 07/30/2014  . Zoster Recombinat (Shingrix) 01/09/2020   Advanced directives: End of life planning; Advance aging; Advanced directives discussed.  Copy of current HCPOA/Living Will requested.    Conditions/risks identified: none new.  Follow up in one year for your annual wellness visit.   Preventive Care 33 Years and Older, Male Preventive care refers to lifestyle choices and visits with your health care provider that can promote health and wellness. What does preventive care include?  A yearly physical exam. This is also called an annual well check.  Dental exams once or twice a year.  Routine eye  exams. Ask your health care provider how often you should have your eyes checked.  Personal lifestyle choices, including:  Daily care of your teeth and gums.  Regular physical activity.  Eating a healthy diet.  Avoiding tobacco and drug use.  Limiting alcohol use.  Practicing safe sex.  Taking low doses of aspirin every day.  Taking vitamin and mineral supplements as recommended by your health care provider. What happens during an annual well check? The services and screenings done by your health care provider during your annual well check will depend on your age, overall health, lifestyle risk factors, and family history of disease. Counseling  Your health care provider may ask you questions about your:  Alcohol use.  Tobacco use.  Drug use.  Emotional well-being.  Home and relationship well-being.  Sexual activity.  Eating habits.  History of falls.  Memory and ability to understand (cognition).  Work and work Statistician. Screening  You may have the following tests or measurements:  Height, weight, and BMI.  Blood pressure.  Lipid and cholesterol levels. These may be checked every 5 years, or more frequently if you are over 92 years old.  Skin check.  Lung cancer screening. You may have this screening every year starting at age 26 if you have a 30-pack-year history of smoking and currently smoke or have quit within the past 15 years.  Fecal occult blood test (FOBT) of the stool. You may have this test every year starting at age 57.  Flexible sigmoidoscopy or colonoscopy. You may have a sigmoidoscopy every 5 years  or a colonoscopy every 10 years starting at age 50.  Prostate cancer screening. Recommendations will vary depending on your family history and other risks.  Hepatitis C blood test.  Hepatitis B blood test.  Sexually transmitted disease (STD) testing.  Diabetes screening. This is done by checking your blood sugar (glucose) after you have  not eaten for a while (fasting). You may have this done every 1-3 years.  Abdominal aortic aneurysm (AAA) screening. You may need this if you are a current or former smoker.  Osteoporosis. You may be screened starting at age 38 if you are at high risk. Talk with your health care provider about your test results, treatment options, and if necessary, the need for more tests. Vaccines  Your health care provider may recommend certain vaccines, such as:  Influenza vaccine. This is recommended every year.  Tetanus, diphtheria, and acellular pertussis (Tdap, Td) vaccine. You may need a Td booster every 10 years.  Zoster vaccine. You may need this after age 61.  Pneumococcal 13-valent conjugate (PCV13) vaccine. One dose is recommended after age 54.  Pneumococcal polysaccharide (PPSV23) vaccine. One dose is recommended after age 91. Talk to your health care provider about which screenings and vaccines you need and how often you need them. This information is not intended to replace advice given to you by your health care provider. Make sure you discuss any questions you have with your health care provider. Document Released: 12/13/2015 Document Revised: 08/05/2016 Document Reviewed: 09/17/2015 Elsevier Interactive Patient Education  2017 [place] Prevention in the Home Falls can cause injuries. They can happen to people of all ages. There are many things you can do to make your home safe and to help prevent falls. What can I do on the outside of my home?  Regularly fix the edges of walkways and driveways and fix any cracks.  Remove anything that might make you trip as you walk through a door, such as a raised step or threshold.  Trim any bushes or trees on the path to your home.  Use bright outdoor lighting.  Clear any walking paths of anything that might make someone trip, such as rocks or tools.  Regularly check to see if handrails are loose or broken. Make sure that both  sides of any steps have handrails.  Any raised decks and porches should have guardrails on the edges.  Have any leaves, snow, or ice cleared regularly.  Use sand or salt on walking paths during winter.  Clean up any spills in your garage right away. This includes oil or grease spills. What can I do in the bathroom?  Use night lights.  Install grab bars by the toilet and in the tub and shower. Do not use towel bars as grab bars.  Use non-skid mats or decals in the tub or shower.  If you need to sit down in the shower, use a plastic, non-slip stool.  Keep the floor dry. Clean up any water that spills on the floor as soon as it happens.  Remove soap buildup in the tub or shower regularly.  Attach bath mats securely with double-sided non-slip rug tape.  Do not have throw rugs and other things on the floor that can make you trip. What can I do in the bedroom?  Use night lights.  Make sure that you have a light by your bed that is easy to reach.  Do not use any sheets or blankets that are too big for your  bed. They should not hang down onto the floor.  Have a firm chair that has side arms. You can use this for support while you get dressed.  Do not have throw rugs and other things on the floor that can make you trip. What can I do in the kitchen?  Clean up any spills right away.  Avoid walking on wet floors.  Keep items that you use a lot in easy-to-reach places.  If you need to reach something above you, use a strong step stool that has a grab bar.  Keep electrical cords out of the way.  Do not use floor polish or wax that makes floors slippery. If you must use wax, use non-skid floor wax.  Do not have throw rugs and other things on the floor that can make you trip. What can I do with my stairs?  Do not leave any items on the stairs.  Make sure that there are handrails on both sides of the stairs and use them. Fix handrails that are broken or loose. Make sure that  handrails are as long as the stairways.  Check any carpeting to make sure that it is firmly attached to the stairs. Fix any carpet that is loose or worn.  Avoid having throw rugs at the top or bottom of the stairs. If you do have throw rugs, attach them to the floor with carpet tape.  Make sure that you have a light switch at the top of the stairs and the bottom of the stairs. If you do not have them, ask someone to add them for you. What else can I do to help prevent falls?  Wear shoes that:  Do not have high heels.  Have rubber bottoms.  Are comfortable and fit you well.  Are closed at the toe. Do not wear sandals.  If you use a stepladder:  Make sure that it is fully opened. Do not climb a closed stepladder.  Make sure that both sides of the stepladder are locked into place.  Ask someone to hold it for you, if possible.  Clearly mark and make sure that you can see:  Any grab bars or handrails.  First and last steps.  Where the edge of each step is.  Use tools that help you move around (mobility aids) if they are needed. These include:  Canes.  Walkers.  Scooters.  Crutches.  Turn on the lights when you go into a dark area. Replace any light bulbs as soon as they burn out.  Set up your furniture so you have a clear path. Avoid moving your furniture around.  If any of your floors are uneven, fix them.  If there are any pets around you, be aware of where they are.  Review your medicines with your doctor. Some medicines can make you feel dizzy. This can increase your chance of falling. Ask your doctor what other things that you can do to help prevent falls. This information is not intended to replace advice given to you by your health care provider. Make sure you discuss any questions you have with your health care provider. Document Released: 09/12/2009 Document Revised: 04/23/2016 Document Reviewed: 12/21/2014 Elsevier Interactive Patient Education  2017  [employer].

## 2021-05-07 DIAGNOSIS — C44612 Basal cell carcinoma of skin of right upper limb, including shoulder: Secondary | ICD-10-CM | POA: Diagnosis not present

## 2021-07-12 DIAGNOSIS — Z03818 Encounter for observation for suspected exposure to other biological agents ruled out: Secondary | ICD-10-CM | POA: Diagnosis not present

## 2021-07-12 DIAGNOSIS — U071 COVID-19: Secondary | ICD-10-CM | POA: Diagnosis not present

## 2021-07-12 DIAGNOSIS — Z2089 Contact with and (suspected) exposure to other communicable diseases: Secondary | ICD-10-CM | POA: Diagnosis not present

## 2021-07-14 ENCOUNTER — Telehealth: Payer: Self-pay

## 2021-07-14 NOTE — Telephone Encounter (Signed)
Patients wife called into access nurse over the weekend.   Per triage note- Possible COVID exposure and having congestion, sore throat, cough, chest tightness. Drinking fluids and urinating normal. Pain rated 3/10.Using decongestant and tylenol. Denies fever, sob, or any other sx.  Disposition from access nurse: Go to ED now or PCP triage.   Confirmed per chart patient was evaluated this weekend at [place]. Positive covid. Was given oral antiviral medication and advised to follow up with pcp as needed.

## 2021-07-14 NOTE — Telephone Encounter (Signed)
fyi

## 2021-07-15 NOTE — Telephone Encounter (Signed)
FYI-  Spoke with patients wife. They have both been evaluated at urgent care and being treated with antiviral and symptoms are very mild. No problems at this time. Advised to let us know if they need anything.

## 2021-07-15 NOTE — Telephone Encounter (Signed)
Please call and see how he is doing.  Any problems?

## 2021-07-21 ENCOUNTER — Encounter: Payer: Self-pay | Admitting: Internal Medicine

## 2021-07-21 NOTE — Telephone Encounter (Signed)
Please call Mr Trundy that I was out of the office 07/21/21.  Confirm no acute symptoms.  Hold for work in appt.  (May have one on Friday).  See me about this.

## 2021-07-23 NOTE — Telephone Encounter (Signed)
Called patient to follow up. He is still having some lingering congestion. No chest pain, sob, fever, etc. Did not feel work in appt was needed. Advised that sometimes patients can still test positive after treatment and quarantine period. Patient says he read that on the CDC guidleines as well. Patient says to let Dr Nicki Reaper know that him and his wife have done well and will let us know if they need anything

## 2021-07-23 NOTE — Telephone Encounter (Signed)
Pt returned your call.  

## 2021-07-23 NOTE — Telephone Encounter (Signed)
LMTCB

## 2021-09-19 ENCOUNTER — Encounter: Payer: Self-pay | Admitting: Internal Medicine

## 2021-09-19 ENCOUNTER — Other Ambulatory Visit: Payer: Self-pay

## 2021-09-19 ENCOUNTER — Ambulatory Visit (INDEPENDENT_AMBULATORY_CARE_PROVIDER_SITE_OTHER): Payer: Medicare HMO | Admitting: Internal Medicine

## 2021-09-19 DIAGNOSIS — J069 Acute upper respiratory infection, unspecified: Secondary | ICD-10-CM | POA: Diagnosis not present

## 2021-09-19 DIAGNOSIS — Z862 Personal history of diseases of the blood and blood-forming organs and certain disorders involving the immune mechanism: Secondary | ICD-10-CM | POA: Diagnosis not present

## 2021-09-19 DIAGNOSIS — E7800 Pure hypercholesterolemia, unspecified: Secondary | ICD-10-CM | POA: Diagnosis not present

## 2021-09-19 MED ORDER — CEFDINIR 300 MG PO CAPS: 300.0000 mg | ORAL_CAPSULE | Freq: Two times a day (BID) | ORAL | 0 refills | Status: DC

## 2021-09-19 NOTE — Progress Notes (Signed)
[name], male   DOB: July 29, 1946, 75 y.o.   MRN: [id number]   Subjective:    [name] [facility], male    DOB: 1946/11/15, 75 y.o.   MRN: [id number]  This visit occurred during the SARS-CoV-2 public health emergency.  Safety protocols were in place, including screening questions prior to the visit, additional usage of staff PPE, and extensive cleaning of exam room while observing appropriate contact time as indicated for disinfecting solutions.   Patient here for physical.  Was having congestion. Appt changed to f/u appt.  Chief Complaint  Patient presents with   Sinusitis   .   HPI Initially scheduled for physical.  Changed to f/u given sinus infection.  Had covid.  [facility]uated [facility] - [facility]12/21.  Prescribed molnupiravir.  Had persistent cough for a long time after covid.  Reports that approximately 10 days ago, he developed sinus pressure.  Facial congestion.  No chest congestion.  Some throat congestion.  Bright yellow mucus at times.  No chest pain or sob.  No vomiting or nausea.  Started taking mucinex 2 days ago.  Has helped to thin mucus.  Increased stress.  Building a house.  Overall appears to be handling things well.   Past Medical History:  Diagnosis Date   Anemia    Cancer ([place])    skin   Hypercholesterolemia    Past Surgical History:  Procedure Laterality Date   COLONOSCOPY WITH PROPOFOL N/A 01/06/2016   Procedure: COLONOSCOPY WITH PROPOFOL;  Surgeon: Lollie Sails, MD;  Location: [facility] ENDOSCOPY;  Service: Endoscopy;  Laterality: N/A;   HERNIA REPAIR  1983   HERNIA REPAIR     INGUINAL HERNIA REPAIR Right 06/25/2015   Procedure: HERNIA REPAIR INGUINAL ADULT;  Surgeon: Leonie Green, MD;  Location: [facility] ORS;  Service: General;  Laterality: Right;   plate in rt forearm Right    Rod placed Rt leg Right    SKIN CANCER EXCISION  4/15   BCCA-shoulder   Family History  Problem Relation Age of Onset   Diabetes Mother    Diabetes Brother     Diabetes Brother    Prostate cancer Neg Hx    Colon cancer Neg Hx    Social History   Socioeconomic History   Marital status: Married    Spouse name: Not on file   Number of children: 2   Years of education: Not on file   Highest education level: Not on file  Occupational History   Not on file  Tobacco Use   Smoking status: Never   Smokeless tobacco: Never  Substance and Sexual Activity   Alcohol use: Yes    Alcohol/week: 0.0 standard drinks    Comment: rare   Drug use: No   Sexual activity: Yes  Other Topics Concern   Not on file  Social History Narrative   Not on file   Social Determinants of Health   Financial Resource Strain: Low Risk    Difficulty of Paying Living Expenses: Not hard at all  Food Insecurity: No Food Insecurity   Worried About Charity fundraiser in the Last Year: Never true   [place] in the Last Year: Never true  Transportation Needs: No Transportation Needs   Lack of Transportation (Medical): No   Lack of Transportation (Non-Medical): No  Physical Activity: Sufficiently Active   Days of Exercise per Week: 3 days   Minutes of Exercise per Session: 60 min  Stress: No  Stress Concern Present   Feeling of Stress : Not at all  Social Connections: Unknown   Frequency of Communication with Friends and Family: More than three times a week   Frequency of Social Gatherings with Friends and Family: More than three times a week   Attends Religious Services: Not on Electrical engineer or Organizations: Not on file   Attends Archivist Meetings: Not on file   Marital Status: Married     Review of Systems  Constitutional:  Negative for appetite change, fever and unexpected weight change.  HENT:  Positive for congestion, postnasal drip and sinus pressure.   Respiratory:  Negative for chest tightness and shortness of breath.        Some cough as outlined.   Cardiovascular:  Negative for chest pain, palpitations and leg  swelling.  Gastrointestinal:  Negative for abdominal pain, diarrhea, nausea and vomiting.  Genitourinary:  Negative for difficulty urinating and dysuria.  Musculoskeletal:  Negative for joint swelling and myalgias.  Skin:  Negative for color change and rash.  Neurological:  Negative for dizziness, light-headedness and headaches.  Psychiatric/Behavioral:  Negative for agitation and dysphoric mood.       Objective:     BP 128/76   Pulse 86   Temp (!) 97.1 F (36.2 C)   Resp 16   Ht 5\' 10"  (1.778 m)   Wt 164 lb 9.6 oz (74.7 kg)   SpO2 98%   BMI 23.62 kg/m  Wt Readings from Last 3 Encounters:  09/19/21 164 lb 9.6 oz (74.7 kg)  04/22/21 165 lb 3.2 oz (74.9 kg)  09/26/20 162 lb 9.6 oz (73.8 kg)    Physical Exam Constitutional:      General: He is not in acute distress.    Appearance: Normal appearance. He is well-developed.  HENT:     Head: Normocephalic and atraumatic.     Right Ear: External ear normal.     Left Ear: External ear normal.  Eyes:     General: No scleral icterus.       Right eye: No discharge.        Left eye: No discharge.  Cardiovascular:     Rate and Rhythm: Normal rate and regular rhythm.  Pulmonary:     Effort: Pulmonary effort is normal. No respiratory distress.     Breath sounds: Normal breath sounds.  Abdominal:     General: Bowel sounds are normal.     Palpations: Abdomen is soft.     Tenderness: There is no abdominal tenderness.  Musculoskeletal:        General: No swelling or tenderness.     Cervical back: Neck supple. No tenderness.  Lymphadenopathy:     Cervical: No cervical adenopathy.  Skin:    Findings: No erythema or rash.  Neurological:     Mental Status: He is alert.  Psychiatric:        Mood and Affect: Mood normal.        Behavior: Behavior normal.     Outpatient Encounter Medications as of 09/19/2021  Medication Sig   cefdinir (OMNICEF) 300 MG capsule Take 1 capsule (300 mg total) by mouth 2 (two) times daily.    rosuvastatin (CRESTOR) 10 MG tablet Take 1 tablet (10 mg total) by mouth daily.   No facility-administered encounter medications on file as of 09/19/2021.     Lab Results  Component Value Date   WBC 5.5 09/26/2020   HGB 13.8 09/26/2020  HCT 40.9 09/26/2020   PLT 197.0 09/26/2020   GLUCOSE 92 09/26/2020   CHOL 195 09/26/2020   TRIG 164.0 (H) 09/26/2020   HDL 40.40 09/26/2020   LDLCALC 122 (H) 09/26/2020   ALT 11 11/11/2020   AST 15 11/11/2020   NA 140 09/26/2020   K 4.2 09/26/2020   CL 103 09/26/2020   CREATININE 1.15 09/26/2020   BUN 17 09/26/2020   CO2 30 09/26/2020   TSH 3.27 09/26/2020   PSA 1.03 09/26/2020       Assessment & Plan:   Problem List Items Addressed This Visit     History of anemia    Follow cbc. Stay up to date with colonoscopy.       Relevant Orders   CBC with Differential/Platelet   Hypercholesterolemia    Low cholesterol diet and exercise.  On crestor.  Follow lipid panel and liver function tests.        Relevant Orders   CBC with Differential/Platelet   Comprehensive metabolic panel   Lipid panel   TSH   URI (upper respiratory infection)    Symptoms as outlined.  Persistent.  Recent covid in 06/2021.  Concern regarding possible bacterial infection (sinus/uri).  Continue mucinex.  Saline nasal spray and nasacort nasal spray as directed.  Delsym if needed for cough.  ominicef as directed.  Instructed on taking probiotics as directed.  Follow.  Notify me if symptoms persists.        Relevant Medications   cefdinir (OMNICEF) 300 MG capsule     Einar Pheasant, MD

## 2021-09-19 NOTE — Patient Instructions (Signed)
Continue mucinex  Saline nasal spray - flush nose at 2x/day  Nasacort nasal spray - 2 sprays each nostril one time per day.  Do this in the evening.    Delsym cough syrup - as directed (as needed for cough).

## 2021-09-20 ENCOUNTER — Encounter: Payer: Self-pay | Admitting: Internal Medicine

## 2021-09-20 DIAGNOSIS — J069 Acute upper respiratory infection, unspecified: Secondary | ICD-10-CM | POA: Insufficient documentation

## 2021-09-20 NOTE — Assessment & Plan Note (Signed)
Low cholesterol diet and exercise.  On crestor.  Follow lipid panel and liver function tests.  

## 2021-09-20 NOTE — Assessment & Plan Note (Signed)
Follow cbc. Stay up to date with colonoscopy.

## 2021-09-20 NOTE — Assessment & Plan Note (Signed)
Symptoms as outlined.  Persistent.  Recent covid in 06/2021.  Concern regarding possible bacterial infection (sinus/uri).  Continue mucinex.  Saline nasal spray and nasacort nasal spray as directed.  Delsym if needed for cough.  ominicef as directed.  Instructed on taking probiotics as directed.  Follow.  Notify me if symptoms persists.

## 2021-09-23 ENCOUNTER — Other Ambulatory Visit: Payer: Self-pay

## 2021-09-23 ENCOUNTER — Other Ambulatory Visit (INDEPENDENT_AMBULATORY_CARE_PROVIDER_SITE_OTHER): Payer: Medicare HMO

## 2021-09-23 DIAGNOSIS — Z862 Personal history of diseases of the blood and blood-forming organs and certain disorders involving the immune mechanism: Secondary | ICD-10-CM | POA: Diagnosis not present

## 2021-09-23 DIAGNOSIS — E7800 Pure hypercholesterolemia, unspecified: Secondary | ICD-10-CM | POA: Diagnosis not present

## 2021-09-23 LAB — COMPREHENSIVE METABOLIC PANEL
ALT: 14 U/L (ref 0–53)
AST: 18 U/L (ref 0–37)
Albumin: 4.3 g/dL (ref 3.5–5.2)
Alkaline Phosphatase: 40 U/L (ref 39–117)
BUN: 16 mg/dL (ref 6–23)
CO2: 30 mEq/L (ref 19–32)
Calcium: 9.3 mg/dL (ref 8.4–10.5)
Chloride: 105 mEq/L (ref 96–112)
Creatinine, Ser: 1.25 mg/dL (ref 0.40–1.50)
GFR: 55.84 mL/min — ABNORMAL LOW (ref >60.00)
Glucose, Bld: 95 mg/dL (ref 70–99)
Potassium: 4.4 mEq/L (ref 3.5–5.1)
Sodium: 141 mEq/L (ref 135–145)
Total Bilirubin: 0.7 mg/dL (ref 0.2–1.2)
Total Protein: 6.9 g/dL (ref 6.0–8.3)

## 2021-09-23 LAB — LIPID PANEL
Cholesterol: 172 mg/dL (ref 0–200)
HDL: 37.8 mg/dL — ABNORMAL LOW (ref >39.00)
LDL Cholesterol: 109 mg/dL — ABNORMAL HIGH (ref 0–99)
NonHDL: 133.76
Total CHOL/HDL Ratio: 5
Triglycerides: 122 mg/dL (ref 0.0–149.0)
VLDL: 24.4 mg/dL (ref 0.0–40.0)

## 2021-09-23 LAB — CBC WITH DIFFERENTIAL/PLATELET
Basophils Absolute: 0.1 10*3/uL (ref 0.0–0.1)
Basophils Relative: 1.1 % (ref 0.0–3.0)
Eosinophils Absolute: 0.3 10*3/uL (ref 0.0–0.7)
Eosinophils Relative: 5.3 % — ABNORMAL HIGH (ref 0.0–5.0)
HCT: 40.6 % (ref 39.0–52.0)
Hemoglobin: 13.4 g/dL (ref 13.0–17.0)
Lymphocytes Relative: 25.2 % (ref 12.0–46.0)
Lymphs Abs: 1.4 10*3/uL (ref 0.7–4.0)
MCHC: 33 g/dL (ref 30.0–36.0)
MCV: 93.8 fl (ref 78.0–100.0)
Monocytes Absolute: 0.5 10*3/uL (ref 0.1–1.0)
Monocytes Relative: 9.1 % (ref 3.0–12.0)
Neutro Abs: 3.2 10*3/uL (ref 1.4–7.7)
Neutrophils Relative %: 59.3 % (ref 43.0–77.0)
Platelets: 243 10*3/uL (ref 150.0–400.0)
RBC: 4.32 Mil/uL (ref 4.22–5.81)
RDW: 13.7 % (ref 11.5–15.5)
WBC: 5.4 10*3/uL (ref 4.0–10.5)

## 2021-09-23 LAB — TSH: TSH: 2.96 u[IU]/mL (ref 0.35–5.50)

## 2021-09-24 ENCOUNTER — Other Ambulatory Visit: Payer: Self-pay | Admitting: Internal Medicine

## 2021-09-24 ENCOUNTER — Encounter: Payer: Self-pay | Admitting: Internal Medicine

## 2021-09-24 DIAGNOSIS — Z125 Encounter for screening for malignant neoplasm of prostate: Secondary | ICD-10-CM

## 2021-09-24 DIAGNOSIS — R7989 Other specified abnormal findings of blood chemistry: Secondary | ICD-10-CM

## 2021-09-24 NOTE — Progress Notes (Signed)
Order placed for f/u labs.  

## 2021-09-25 ENCOUNTER — Other Ambulatory Visit: Payer: Medicare HMO

## 2021-09-25 DIAGNOSIS — D2262 Melanocytic nevi of left upper limb, including shoulder: Secondary | ICD-10-CM | POA: Diagnosis not present

## 2021-09-25 DIAGNOSIS — D2271 Melanocytic nevi of right lower limb, including hip: Secondary | ICD-10-CM | POA: Diagnosis not present

## 2021-09-25 DIAGNOSIS — D0462 Carcinoma in situ of skin of left upper limb, including shoulder: Secondary | ICD-10-CM | POA: Diagnosis not present

## 2021-09-25 DIAGNOSIS — D485 Neoplasm of uncertain behavior of skin: Secondary | ICD-10-CM | POA: Diagnosis not present

## 2021-09-25 DIAGNOSIS — D2261 Melanocytic nevi of right upper limb, including shoulder: Secondary | ICD-10-CM | POA: Diagnosis not present

## 2021-09-25 DIAGNOSIS — L570 Actinic keratosis: Secondary | ICD-10-CM | POA: Diagnosis not present

## 2021-09-25 DIAGNOSIS — C44319 Basal cell carcinoma of skin of other parts of face: Secondary | ICD-10-CM | POA: Diagnosis not present

## 2021-09-25 DIAGNOSIS — X32XXXA Exposure to sunlight, initial encounter: Secondary | ICD-10-CM | POA: Diagnosis not present

## 2021-09-25 DIAGNOSIS — Z85828 Personal history of other malignant neoplasm of skin: Secondary | ICD-10-CM | POA: Diagnosis not present

## 2021-09-25 NOTE — Telephone Encounter (Signed)
The omnicef (cefdinir) should not be causing the headaches.  If he is having worsening headaches, then he needs to be reevaluated.

## 2021-09-25 NOTE — Telephone Encounter (Signed)
Patient was notified at wifes appt

## 2021-09-26 ENCOUNTER — Encounter: Payer: Medicare HMO | Admitting: Internal Medicine

## 2021-10-02 ENCOUNTER — Other Ambulatory Visit (INDEPENDENT_AMBULATORY_CARE_PROVIDER_SITE_OTHER): Payer: Medicare HMO

## 2021-10-02 ENCOUNTER — Other Ambulatory Visit: Payer: Self-pay

## 2021-10-02 DIAGNOSIS — Z125 Encounter for screening for malignant neoplasm of prostate: Secondary | ICD-10-CM

## 2021-10-02 DIAGNOSIS — R7989 Other specified abnormal findings of blood chemistry: Secondary | ICD-10-CM | POA: Diagnosis not present

## 2021-10-03 ENCOUNTER — Other Ambulatory Visit: Payer: Medicare HMO

## 2021-10-03 LAB — BASIC METABOLIC PANEL
BUN: 21 mg/dL (ref 6–23)
CO2: 25 mEq/L (ref 19–32)
Calcium: 9 mg/dL (ref 8.4–10.5)
Chloride: 106 mEq/L (ref 96–112)
Creatinine, Ser: 1.32 mg/dL (ref 0.40–1.50)
GFR: 52.29 mL/min — ABNORMAL LOW (ref >60.00)
Glucose, Bld: 120 mg/dL — ABNORMAL HIGH (ref 70–99)
Potassium: 4.3 mEq/L (ref 3.5–5.1)
Sodium: 140 mEq/L (ref 135–145)

## 2021-10-03 LAB — URINALYSIS, ROUTINE W REFLEX MICROSCOPIC
Bilirubin Urine: NEGATIVE
Hgb urine dipstick: NEGATIVE
Ketones, ur: NEGATIVE
Leukocytes,Ua: NEGATIVE
Nitrite: NEGATIVE
RBC / HPF: NONE SEEN (ref 0–?)
Specific Gravity, Urine: 1.02 (ref 1.000–1.030)
Total Protein, Urine: NEGATIVE
Urine Glucose: NEGATIVE
Urobilinogen, UA: 1 (ref 0.0–1.0)
pH: 6 (ref 5.0–8.0)

## 2021-10-03 LAB — PSA, MEDICARE: PSA: 1.32 ng/ml (ref 0.10–4.00)

## 2021-10-06 NOTE — Addendum Note (Signed)
Addended by: Elpidio Galea T on: 10/06/2021 10:26 AM   Modules accepted: Orders

## 2021-10-16 ENCOUNTER — Ambulatory Visit (INDEPENDENT_AMBULATORY_CARE_PROVIDER_SITE_OTHER): Payer: Medicare HMO

## 2021-10-16 ENCOUNTER — Other Ambulatory Visit: Payer: Self-pay

## 2021-10-16 DIAGNOSIS — Z961 Presence of intraocular lens: Secondary | ICD-10-CM | POA: Diagnosis not present

## 2021-10-16 DIAGNOSIS — Z23 Encounter for immunization: Secondary | ICD-10-CM

## 2021-10-16 DIAGNOSIS — H524 Presbyopia: Secondary | ICD-10-CM | POA: Diagnosis not present

## 2021-10-22 ENCOUNTER — Other Ambulatory Visit: Payer: Self-pay | Admitting: Internal Medicine

## 2021-11-03 ENCOUNTER — Other Ambulatory Visit (INDEPENDENT_AMBULATORY_CARE_PROVIDER_SITE_OTHER): Payer: Medicare HMO

## 2021-11-03 ENCOUNTER — Other Ambulatory Visit: Payer: Self-pay

## 2021-11-03 DIAGNOSIS — R7989 Other specified abnormal findings of blood chemistry: Secondary | ICD-10-CM

## 2021-11-03 LAB — BASIC METABOLIC PANEL
BUN: 13 mg/dL (ref 6–23)
CO2: 29 mEq/L (ref 19–32)
Calcium: 9.1 mg/dL (ref 8.4–10.5)
Chloride: 106 mEq/L (ref 96–112)
Creatinine, Ser: 1.11 mg/dL (ref 0.40–1.50)
GFR: 64.34 mL/min (ref >60.00)
Glucose, Bld: 94 mg/dL (ref 70–99)
Potassium: 4 mEq/L (ref 3.5–5.1)
Sodium: 141 mEq/L (ref 135–145)

## 2021-11-25 ENCOUNTER — Ambulatory Visit: Payer: Medicare HMO | Admitting: Internal Medicine

## 2021-12-01 DIAGNOSIS — C44319 Basal cell carcinoma of skin of other parts of face: Secondary | ICD-10-CM | POA: Diagnosis not present

## 2021-12-08 DIAGNOSIS — D0462 Carcinoma in situ of skin of left upper limb, including shoulder: Secondary | ICD-10-CM | POA: Diagnosis not present

## 2022-03-30 DIAGNOSIS — Z85828 Personal history of other malignant neoplasm of skin: Secondary | ICD-10-CM | POA: Diagnosis not present

## 2022-03-30 DIAGNOSIS — D2271 Melanocytic nevi of right lower limb, including hip: Secondary | ICD-10-CM | POA: Diagnosis not present

## 2022-03-30 DIAGNOSIS — D2261 Melanocytic nevi of right upper limb, including shoulder: Secondary | ICD-10-CM | POA: Diagnosis not present

## 2022-03-30 DIAGNOSIS — D225 Melanocytic nevi of trunk: Secondary | ICD-10-CM | POA: Diagnosis not present

## 2022-03-30 DIAGNOSIS — D2262 Melanocytic nevi of left upper limb, including shoulder: Secondary | ICD-10-CM | POA: Diagnosis not present

## 2022-03-30 DIAGNOSIS — L570 Actinic keratosis: Secondary | ICD-10-CM | POA: Diagnosis not present

## 2022-03-30 DIAGNOSIS — X32XXXA Exposure to sunlight, initial encounter: Secondary | ICD-10-CM | POA: Diagnosis not present

## 2022-04-23 ENCOUNTER — Ambulatory Visit: Payer: Medicare HMO | Admitting: Internal Medicine

## 2022-05-04 ENCOUNTER — Telehealth: Payer: Self-pay | Admitting: Internal Medicine

## 2022-05-04 NOTE — Telephone Encounter (Signed)
Pt is transferring over to [facility] To Medicare... Pt appointment is scheduled for 05/08/2022 at 8am..Marland Kitchen

## 2022-05-08 ENCOUNTER — Ambulatory Visit: Payer: Medicare HMO | Admitting: Internal Medicine

## 2022-05-08 ENCOUNTER — Ambulatory Visit (INDEPENDENT_AMBULATORY_CARE_PROVIDER_SITE_OTHER): Payer: Medicare HMO

## 2022-05-08 VITALS — Ht 70.0 in | Wt 164.0 lb

## 2022-05-08 DIAGNOSIS — Z0000 Encounter for general adult medical examination without abnormal findings: Secondary | ICD-10-CM

## 2022-05-08 NOTE — Patient Instructions (Addendum)
  Troy Arnold , Thank you for taking time to come for your Medicare Wellness Visit. I appreciate your ongoing commitment to your health goals. Please review the following plan we discussed and let me know if I can assist you in the future.   These are the goals we discussed:  Goals       Patient Stated     Maintain Healthy Lifestyle (pt-stated)      Mediterranean diet  Stay active        This is a list of the screening recommended for you and due dates:  Health Maintenance  Topic Date Due   COVID-19 Vaccine (3 - Pfizer risk series) 05/24/2022*   Zoster (Shingles) Vaccine (2 of 2) 08/08/2022*   Flu Shot  06/30/2022   Tetanus Vaccine  07/30/2024   Pneumonia Vaccine  Completed   Hepatitis C Screening: USPSTF Recommendation to screen - Ages 18-79 yo.  Completed   HPV Vaccine  Aged Out   Colon Cancer Screening  Discontinued  *Topic was postponed. The date shown is not the original due date.

## 2022-05-08 NOTE — Progress Notes (Signed)
Subjective:   Troy Arnold is a 76 y.o. male who presents for Medicare Annual/Subsequent preventive examination.  Review of Systems    No ROS.  Medicare Wellness Virtual Visit.  Visual/audio telehealth visit, UTA vital signs.   See social history for additional risk factors.   Cardiac Risk Factors include: advanced age (>80mn, >>48women);male gender     Objective:    Today's Vitals   05/08/22 0902  Weight: 164 lb (74.4 kg)  Height: '5\' 10"'$  (1.778 m)   Body mass index is 23.53 kg/m.     05/08/2022    9:24 AM 04/22/2021    3:58 PM 03/14/2020   11:26 AM 09/05/2015    9:03 AM 06/25/2015    9:41 AM 06/10/2015   11:52 AM 05/24/2015   11:48 AM  Advanced Directives  Does Patient Have a Medical Advance Directive? Yes Yes Yes Yes Yes Yes No  Type of AParamedicof [place] will [place] will Healthcare Power of [place]Living will [place]New [place]Living will [place]Living will   Does patient want to make changes to medical advance directive? No - Patient declined No - Patient declined No - Patient declined No - Patient declined No - Patient declined    Copy of HBakerin Chart? No - copy requested No - copy requested No - copy requested Yes     Would patient like information on creating a medical advance directive?       Yes - Educational materials given    Current Medications (verified) Outpatient Encounter Medications as of 05/08/2022  Medication Sig   rosuvastatin (CRESTOR) 10 MG tablet TAKE 1 TABLET BY MOUTH EVERY DAY (Patient not taking: Reported on 05/08/2022)   [DISCONTINUED] cefdinir (OMNICEF) 300 MG capsule Take 1 capsule (300 mg total) by mouth 2 (two) times daily.   No facility-administered encounter medications on file as of 05/08/2022.    Allergies (verified) Patient has no known allergies.   History: Past Medical History:   Diagnosis Date   Anemia    Cancer ([place]    skin   Hypercholesterolemia    Past Surgical History:  Procedure Laterality Date   COLONOSCOPY WITH PROPOFOL N/A 01/06/2016   Procedure: COLONOSCOPY WITH PROPOFOL;  Surgeon: MLollie Sails MD;  [facility];  Service: Endoscopy;  Laterality: N/A;   HERNIA REPAIR  1983   HERNIA REPAIR     INGUINAL HERNIA REPAIR Right 06/25/2015   Procedure: HERNIA REPAIR INGUINAL ADULT;  Surgeon: JLeonie Green MD;  Location: [facility] ORS;  Service: General;  Laterality: Right;   plate in rt forearm Right    Rod placed Rt leg Right    SKIN CANCER EXCISION  4/15   BCCA-shoulder   Family History  Problem Relation Age of Onset   Diabetes Mother    Diabetes Brother    Diabetes Brother    Prostate cancer Neg Hx    Colon cancer Neg Hx    Social History   Socioeconomic History   Marital status: Married    Spouse name: Not on file   Number of children: 2   Years of education: Not on file   Highest education level: Not on file  Occupational History   Not on file  Tobacco Use   Smoking status: Never   Smokeless tobacco: Never  Substance and Sexual Activity   Alcohol use: Yes    Alcohol/week: 0.0 standard drinks of  alcohol    Comment: rare   Drug use: No   Sexual activity: Yes  Other Topics Concern   Not on file  Social History Narrative   Not on file   Social Determinants of Health   Financial Resource Strain: Low Risk  (05/08/2022)   Overall Financial Resource Strain (CARDIA)    Difficulty of Paying Living Expenses: Not hard at all  Food Insecurity: No Food Insecurity (05/08/2022)   Hunger Vital Sign    Worried About Running Out of Food in the Last Year: Never true    Ran Out of Food in the Last Year: Never true  Transportation Needs: No Transportation Needs (05/08/2022)   PRAPARE - Hydrologist (Medical): No    Lack of Transportation (Non-Medical): No  Physical Activity: Sufficiently Active  (05/08/2022)   Exercise Vital Sign    Days of Exercise per Week: 3 days    Minutes of Exercise per Session: 60 min  Stress: No Stress Concern Present (05/08/2022)   [place]    Feeling of Stress : Not at all  Social Connections: Unknown (05/08/2022)   Social Connection and Isolation Panel [NHANES]    Frequency of Communication with Friends and Family: More than three times a week    Frequency of Social Gatherings with Friends and Family: More than three times a week    Attends Religious Services: Not on Advertising copywriter or Organizations: Not on file    Attends Archivist Meetings: Not on file    Marital Status: Married    Tobacco Counseling Counseling given: Not Answered   Clinical Intake:  Pre-visit preparation completed: Yes        Diabetes: No  How often do you need to have someone help you when you read instructions, pamphlets, or other written materials from your doctor or pharmacy?: 1 - Never   Interpreter Needed?: No      Activities of Daily Living    05/08/2022    9:10 AM  In your present state of health, do you have any difficulty performing the following activities:  Hearing? 0  Vision? 0  Difficulty concentrating or making decisions? 0  Walking or climbing stairs? 0  Dressing or bathing? 0  Doing errands, shopping? 0  Preparing Food and eating ? N  Using the Toilet? N  In the past six months, have you accidently leaked urine? N  Do you have problems with loss of bowel control? N  Managing your Medications? N  Managing your Finances? N  Housekeeping or managing your Housekeeping? N    Patient Care Team: Einar Pheasant, MD as PCP - General (Internal Medicine)  Indicate any recent Medical Services you may have received from other than [facility] providers in the past year (date may be approximate).     Assessment:   This is a routine wellness examination for  Troy Arnold.  Virtual Visit via Telephone Note  I connected with  Troy Arnold on 05/08/22 at  9:00 AM EDT by telephone and verified that I am speaking with the correct person using two identifiers. Persons participating in the virtual visit: patient/Nurse Health Advisor   I discussed the limitations of performing an evaluation and management service by telehealth. We continued and completed visit with audio only. Some vital signs may be absent or patient reported.   Hearing/Vision screen Hearing Screening - Comments:: Patient is able to hear conversational  tones without difficulty. No issues reported. Vision Screening - Comments:: Followed by Dr. Gloriann Loan  Wears corrective lenses  They have seen their ophthalmologist.   Dietary issues and exercise activities discussed: Current Exercise Habits: Home exercise routine, Intensity: Moderate Regular diet   Goals Addressed               This Visit's Progress     Patient Stated     Maintain Healthy Lifestyle (pt-stated)        Mediterranean diet  Stay active       Depression Screen    05/08/2022    9:05 AM 09/19/2021    9:09 AM 04/22/2021    3:54 PM 03/14/2020   11:48 AM 09/25/2019    8:07 AM 09/20/2017    9:08 AM 09/17/2016    8:18 AM  PHQ 2/9 Scores  PHQ - 2 Score 0 0 0 0 0 0 0    Fall Risk    05/08/2022    9:09 AM 09/19/2021    9:09 AM 04/22/2021    3:58 PM 03/14/2020   11:37 AM 09/25/2019    8:13 AM  [place] in the past year? 0 0 0 0 0  Number falls in past yr:  0 0    Injury with Fall?  0 0    Risk for fall due to :  No Fall Risks     Follow up Falls evaluation completed Falls evaluation completed Falls evaluation completed Falls evaluation completed Falls evaluation completed    [place]: Home free of loose throw rugs in walkways, pet beds, electrical cords, etc? Yes  Adequate lighting in your home to reduce risk of falls? Yes   ASSISTIVE DEVICES UTILIZED TO  PREVENT FALLS: Life alert? No  Use of a cane, walker or w/c? No   TIMED UP AND GO: Was the test performed? No .    Cognitive Function:    09/05/2015    9:11 AM  MMSE - Mini Mental State Exam  Orientation to time 5  Orientation to Place 5  Registration 3  Attention/ Calculation 5  Recall 3  Language- name 2 objects 2  Language- repeat 1  Language- follow 3 step command 3  Language- read & follow direction 1  Write a sentence 1  Copy design 1  Total score 30        03/14/2020   11:55 AM  6CIT Screen  What Year? 0 points  What month? 0 points  What time? 0 points  Count back from 20 0 points  Months in reverse 0 points    Immunizations Immunization History  Administered Date(s) Administered   Fluad Quad(high Dose 65+) 09/26/2020, 10/16/2021   Hepatitis B 04/08/2010, 05/12/2010, 10/08/2010   Influenza Split 07/30/2014   Influenza, High Dose Seasonal PF 09/17/2016, 09/13/2017, 09/19/2018   Influenza,inj,Quad PF,6+ Mos 09/05/2015, 09/08/2019   Influenza-Unspecified 09/08/2011, 09/26/2020   PFIZER(Purple Top)SARS-COV-2 Vaccination 12/25/2019, 01/15/2020   Pneumococcal Conjugate-13 09/05/2015   Pneumococcal Polysaccharide-23 09/20/2017   Td 07/30/2014   Zoster Recombinat (Shingrix) 01/09/2020   Screening Tests Health Maintenance  Topic Date Due   COVID-19 Vaccine (3 - Pfizer risk series) 05/24/2022 (Originally 02/12/2020)   Zoster Vaccines- Shingrix (2 of 2) 08/08/2022 (Originally 03/05/2020)   INFLUENZA VACCINE  06/30/2022   TETANUS/TDAP  07/30/2024   Pneumonia Vaccine 18+ Years old  Completed   Hepatitis C Screening  Completed   HPV VACCINES  Aged Out   COLONOSCOPY (  Pts 45-12yr Insurance coverage will need to be confirmed)  Discontinued   Health Maintenance There are no preventive care reminders to display for this patient.  Lung Cancer Screening: (Low Dose CT Chest recommended if Age 77-80years, 30 pack-year currently smoking OR have quit w/in 15years.)  does not qualify.   Vision Screening: Recommended annual ophthalmology exams for early detection of glaucoma and other disorders of the eye.  Dental Screening: Recommended annual dental exams for proper oral hygiene  Community Resource Referral / Chronic Care Management: CRR required this visit?  No   CCM required this visit?  No      Plan:   Keep all routine maintenance appointments.   I have personally reviewed and noted the following in the patient's chart:   Medical and social history Use of alcohol, tobacco or illicit drugs  Current medications and supplements including opioid prescriptions. Patient is not currently taking opioid prescriptions. Functional ability and status Nutritional status Physical activity Advanced directives List of other physicians Hospitalizations, surgeries, and ER visits in previous 12 months Vitals Screenings to include cognitive, depression, and falls Referrals and appointments  In addition, I have reviewed and discussed with patient certain preventive protocols, quality metrics, and best practice recommendations. A written personalized care plan for preventive services as well as general preventive health recommendations were provided to patient.     OVarney Biles LPN   61/03/[id number]

## 2022-05-13 ENCOUNTER — Telehealth: Payer: Self-pay | Admitting: Internal Medicine

## 2022-05-13 NOTE — Telephone Encounter (Signed)
S/w Butch Penny - stated pt has had this problem going on for a couple of weeks now. Comes and goes - pt feels off balance and dizzy and has low bp. Per Butch Penny, pt now is currently feeling fine, bp came up to 120/87. 101/53 was taken this AM, around 9. 120/87 was taken this afternoon, around 2pm.  Pt scheduled w/ Dr Olivia Mackie tomorrow for eval. Will f/u with you after.

## 2022-05-13 NOTE — Telephone Encounter (Signed)
Pt wife calling stating pt is having problems with him being off balance and dizzy. BP low 91/53 sent to access nurse

## 2022-05-14 ENCOUNTER — Ambulatory Visit (INDEPENDENT_AMBULATORY_CARE_PROVIDER_SITE_OTHER): Payer: Medicare HMO | Admitting: Internal Medicine

## 2022-05-14 ENCOUNTER — Encounter: Payer: Self-pay | Admitting: Internal Medicine

## 2022-05-14 VITALS — BP 123/78 | HR 69 | Temp 97.9°F | Resp 14 | Ht 70.0 in | Wt 163.6 lb

## 2022-05-14 DIAGNOSIS — R42 Dizziness and giddiness: Secondary | ICD-10-CM | POA: Diagnosis not present

## 2022-05-14 DIAGNOSIS — J0110 Acute frontal sinusitis, unspecified: Secondary | ICD-10-CM

## 2022-05-14 DIAGNOSIS — J309 Allergic rhinitis, unspecified: Secondary | ICD-10-CM | POA: Diagnosis not present

## 2022-05-14 DIAGNOSIS — E785 Hyperlipidemia, unspecified: Secondary | ICD-10-CM

## 2022-05-14 MED ORDER — SALINE SPRAY 0.65 % NA SOLN: 2.0000 | Freq: Every day | NASAL | 0 refills | Status: DC

## 2022-05-14 MED ORDER — AZITHROMYCIN 250 MG PO TABS: ORAL_TABLET | ORAL | 0 refills | Status: AC

## 2022-05-14 MED ORDER — CETIRIZINE HCL 10 MG PO TABS: 10.0000 mg | ORAL_TABLET | Freq: Every day | ORAL | 3 refills | Status: DC | PRN

## 2022-05-14 MED ORDER — ROSUVASTATIN CALCIUM 5 MG PO TABS: 5.0000 mg | ORAL_TABLET | Freq: Every day | ORAL | 3 refills | Status: DC

## 2022-05-14 MED ORDER — FLUTICASONE PROPIONATE 50 MCG/ACT NA SUSP: 2.0000 | Freq: Every day | NASAL | 0 refills | Status: DC

## 2022-05-14 NOTE — Progress Notes (Signed)
Chief Complaint  Patient presents with   Sinus Problem    Pt c/o sinus issues around his eyes and nose ongoing for 1 wk, took 1 allergy pill with minor relief. Has had similar episodes before. Denies any dizziness, or lightheadedness. BP's & BS normal at home   F/u  1. Sinus frontal eye pressure around eyes and nose x 1 week took otc allergy med w/o relief ho sinus infection thinks related was dizzy but this resolved lowest cbg checked 81 2. Hld on crestor 10 mg gave him h/a wife takes lower dose he had stopped due to this reason but will to try lower dose     Review of Systems  Constitutional:  Negative for weight loss.  HENT:  Positive for sinus pain. Negative for hearing loss.   Eyes:  Negative for blurred vision.  Respiratory:  Negative for shortness of breath.   Cardiovascular:  Negative for chest pain.  Gastrointestinal:  Negative for abdominal pain and blood in stool.  Musculoskeletal:  Negative for back pain.  Skin:  Negative for rash.  Neurological:  Negative for dizziness and headaches.  Psychiatric/Behavioral:  Negative for depression.    Past Medical History:  Diagnosis Date   Anemia    Cancer ([place])    skin   Hypercholesterolemia    Past Surgical History:  Procedure Laterality Date   cataract surgery     COLONOSCOPY WITH PROPOFOL N/A 01/06/2016   Procedure: COLONOSCOPY WITH PROPOFOL;  Surgeon: Lollie Sails, MD;  Location: [facility];  Service: Endoscopy;  Laterality: N/A;   HERNIA REPAIR  11/30/1981   HERNIA REPAIR     INGUINAL HERNIA REPAIR Right 06/25/2015   Procedure: HERNIA REPAIR INGUINAL ADULT;  Surgeon: Leonie Green, MD;  Location: [facility] ORS;  Service: General;  Laterality: Right;   plate in rt forearm Right    Rod placed Rt leg Right    SKIN CANCER EXCISION  02/28/2014   BCCA-shoulder   Family History  Problem Relation Age of Onset   Diabetes Mother    Diabetes Brother    Diabetes Brother    Prostate cancer Neg Hx    Colon cancer  Neg Hx    Social History   Socioeconomic History   Marital status: Married    Spouse name: Not on file   Number of children: 2   Years of education: Not on file   Highest education level: Not on file  Occupational History   Not on file  Tobacco Use   Smoking status: Never   Smokeless tobacco: Never  Substance and Sexual Activity   Alcohol use: Yes    Alcohol/week: 0.0 standard drinks of alcohol    Comment: rare   Drug use: No   Sexual activity: Yes  Other Topics Concern   Not on file  Social History Narrative   Not on file   Social Determinants of Health   Financial Resource Strain: Low Risk  (05/08/2022)   Overall Financial Resource Strain (CARDIA)    Difficulty of Paying Living Expenses: Not hard at all  Food Insecurity: No Food Insecurity (05/08/2022)   Hunger Vital Sign    Worried About Running Out of Food in the Last Year: Never true    Ran Out of Food in the Last Year: Never true  Transportation Needs: No Transportation Needs (05/08/2022)   PRAPARE - Hydrologist (Medical): No    Lack of Transportation (Non-Medical): No  Physical Activity: Sufficiently Active (05/08/2022)  Exercise Vital Sign    Days of Exercise per Week: 3 days    Minutes of Exercise per Session: 60 min  Stress: No Stress Concern Present (05/08/2022)   [place]    Feeling of Stress : Not at all  Social Connections: Unknown (05/08/2022)   Social Connection and Isolation Panel [NHANES]    Frequency of Communication with Friends and Family: More than three times a week    Frequency of Social Gatherings with Friends and Family: More than three times a week    Attends Religious Services: Not on file    Active Member of Clubs or Organizations: Not on file    Attends Archivist Meetings: Not on file    Marital Status: Married  Intimate Partner Violence: Not At Risk (05/08/2022)   Humiliation, Afraid,  Rape, and Kick questionnaire    Fear of Current or Ex-Partner: No    Emotionally Abused: No    Physically Abused: No    Sexually Abused: No   Current Meds  Medication Sig   azithromycin (ZITHROMAX) 250 MG tablet With food Take 2 tablets on day 1, then 1 tablet daily on days 2 through 5   cetirizine (ZYRTEC) 10 MG tablet Take 1 tablet (10 mg total) by mouth daily as needed for allergies.   fluticasone (FLONASE) 50 MCG/ACT nasal spray Place 2 sprays into both nostrils daily. prn   sodium chloride (OCEAN) 0.65 % SOLN nasal spray Place 2 sprays into both nostrils daily.   No Known Allergies No results found for this or any previous visit (from the past 2160 hour(s)). Objective  Body mass index is 23.47 kg/m. Wt Readings from Last 3 Encounters:  05/14/22 163 lb 9.6 oz (74.2 kg)  05/08/22 164 lb (74.4 kg)  09/19/21 164 lb 9.6 oz (74.7 kg)   Temp Readings from Last 3 Encounters:  05/14/22 97.9 F (36.6 C) (Oral)  09/19/21 (!) 97.1 F (36.2 C)  04/22/21 (!) 97.1 F (36.2 C)   BP Readings from Last 3 Encounters:  05/14/22 123/78  09/19/21 128/76  04/22/21 120/70   Pulse Readings from Last 3 Encounters:  05/14/22 69  09/19/21 86  04/22/21 78    Physical Exam Vitals and nursing note reviewed.  Constitutional:      Appearance: Normal appearance. He is well-developed and well-groomed.  HENT:     Head: Normocephalic and atraumatic.     Nose:     Right Sinus: Maxillary sinus tenderness present.  Eyes:     Conjunctiva/sclera: Conjunctivae normal.     Pupils: Pupils are equal, round, and reactive to light.  Cardiovascular:     Rate and Rhythm: Normal rate and regular rhythm.     Heart sounds: Normal heart sounds.  Pulmonary:     Effort: Pulmonary effort is normal. No respiratory distress.     Breath sounds: Normal breath sounds.  Abdominal:     Tenderness: There is no abdominal tenderness.  Skin:    General: Skin is warm and moist.  Neurological:     General: No focal  deficit present.     Mental Status: He is alert and oriented to person, place, and time. Mental status is at baseline.     Sensory: Sensation is intact.     Motor: Motor function is intact.     Coordination: Coordination is intact.     Gait: Gait is intact. Gait normal.  Psychiatric:        Attention  and Perception: Attention and perception normal.        Mood and Affect: Mood and affect normal.        Speech: Speech normal.        Behavior: Behavior normal. Behavior is cooperative.        Thought Content: Thought content normal.        Cognition and Memory: Cognition and memory normal.        Judgment: Judgment normal.     Assessment  Plan  Allergic rhinitis, unspecified seasonality, unspecified trigger - Plan: cetirizine (ZYRTEC) 10 MG tablet  Acute frontal sinusitis, recurrence not specified - Plan: fluticasone (FLONASE) 50 MCG/ACT nasal spray, sodium chloride (OCEAN) 0.65 % SOLN nasal spray, cetirizine (ZYRTEC) 10 MG tablet, azithromycin (ZITHROMAX) 250 MG tablet  Dizziness resolved   Hyperlipidemia, unspecified hyperlipidemia type - Plan: rosuvastatin (CRESTOR) 5 MG tablet 10 mg gave pt h/a agreeable to lower to '5mg'$  qhs   Provider: Dr. Olivia Mackie McLean-Scocuzza-Internal Medicine

## 2022-05-14 NOTE — Patient Instructions (Addendum)
Your medications are at the pharmacy   Sinus Infection, Adult A sinus infection, also called sinusitis, is inflammation of your sinuses. Sinuses are hollow spaces in the bones around your face. Your sinuses are located: Around your eyes. In the middle of your forehead. Behind your nose. In your cheekbones. Mucus normally drains out of your sinuses. When your nasal tissues become inflamed or swollen, mucus can become trapped or blocked. This allows bacteria, viruses, and fungi to grow, which leads to infection. Most infections of the sinuses are caused by a virus. A sinus infection can develop quickly. It can last for up to 4 weeks (acute) or for more than 12 weeks (chronic). A sinus infection often develops after a cold. What are the causes? This condition is caused by anything that creates swelling in the sinuses or stops mucus from draining. This includes: Allergies. Asthma. Infection from bacteria or viruses. Deformities or blockages in your nose or sinuses. Abnormal growths in the nose (nasal polyps). Pollutants, such as chemicals or irritants in the air. Infection from fungi. This is rare. What increases the risk? You are more likely to develop this condition if you: Have a weak body defense system (immune system). Do a lot of swimming or diving. Overuse nasal sprays. Smoke. What are the signs or symptoms? The main symptoms of this condition are pain and a feeling of pressure around the affected sinuses. Other symptoms include: Stuffy nose or congestion that makes it difficult to breathe through your nose. Thick yellow or greenish drainage from your nose. Tenderness, swelling, and warmth over the affected sinuses. A cough that may get worse at night. Decreased sense of smell and taste. Extra mucus that collects in the throat or the back of the nose (postnasal drip) causing a sore throat or bad breath. Tiredness (fatigue). Fever. How is this diagnosed? This condition is  diagnosed based on: Your symptoms. Your medical history. A physical exam. Tests to find out if your condition is acute or chronic. This may include: Checking your nose for nasal polyps. Viewing your sinuses using a device that has a light (endoscope). Testing for allergies or bacteria. Imaging tests, such as an MRI or CT scan. In rare cases, a bone biopsy may be done to rule out more serious types of fungal sinus disease. How is this treated? Treatment for a sinus infection depends on the cause and whether your condition is chronic or acute. If caused by a virus, your symptoms should go away on their own within 10 days. You may be given medicines to relieve symptoms. They include: Medicines that shrink swollen nasal passages (decongestants). A spray that eases inflammation of the nostrils (topical intranasal corticosteroids). Rinses that help get rid of thick mucus in your nose (nasal saline washes). Medicines that treat allergies (antihistamines). Over-the-counter pain relievers. If caused by bacteria, your health care provider may recommend waiting to see if your symptoms improve. Most bacterial infections will get better without antibiotic medicine. You may be given antibiotics if you have: A severe infection. A weak immune system. If caused by narrow nasal passages or nasal polyps, surgery may be needed. Follow these instructions at home: Medicines Take, use, or apply over-the-counter and prescription medicines only as told by your health care provider. These may include nasal sprays. If you were prescribed an antibiotic medicine, take it as told by your health care provider. Do not stop taking the antibiotic even if you start to feel better. Hydrate and humidify  Drink enough fluid to keep  your urine pale yellow. Staying hydrated will help to thin your mucus. Use a cool mist humidifier to keep the humidity level in your home above 50%. Inhale steam for 10-15 minutes, 3-4 times a  day, or as told by your health care provider. You can do this in the bathroom while a hot shower is running. Limit your exposure to cool or dry air. Rest Rest as much as possible. Sleep with your head raised (elevated). Make sure you get enough sleep each night. General instructions  Apply a warm, moist washcloth to your face 3-4 times a day or as told by your health care provider. This will help with discomfort. Use nasal saline washes as often as told by your health care provider. Wash your hands often with soap and water to reduce your exposure to germs. If soap and water are not available, use hand sanitizer. Do not smoke. Avoid being around people who are smoking (secondhand smoke). Keep all follow-up visits. This is important. Contact a health care provider if: You have a fever. Your symptoms get worse. Your symptoms do not improve within 10 days. Get help right away if: You have a severe headache. You have persistent vomiting. You have severe pain or swelling around your face or eyes. You have vision problems. You develop confusion. Your neck is stiff. You have trouble breathing. These symptoms may be an emergency. Get help right away. Call 911. Do not wait to see if the symptoms will go away. Do not drive yourself to the hospital. Summary A sinus infection is soreness and inflammation of your sinuses. Sinuses are hollow spaces in the bones around your face. This condition is caused by nasal tissues that become inflamed or swollen. The swelling traps or blocks the flow of mucus. This allows bacteria, viruses, and fungi to grow, which leads to infection. If you were prescribed an antibiotic medicine, take it as told by your health care provider. Do not stop taking the antibiotic even if you start to feel better. Keep all follow-up visits. This is important. This information is not intended to replace advice given to you by your health care provider. Make sure you discuss any  questions you have with your health care provider. Document Revised: 10/21/2021 Document Reviewed: 10/21/2021 Elsevier Patient Education  [place].

## 2022-05-15 ENCOUNTER — Other Ambulatory Visit: Payer: Self-pay | Admitting: Internal Medicine

## 2022-05-15 DIAGNOSIS — J0110 Acute frontal sinusitis, unspecified: Secondary | ICD-10-CM

## 2022-06-03 ENCOUNTER — Telehealth: Payer: Self-pay

## 2022-06-03 NOTE — Telephone Encounter (Signed)
Patient's wife, Praneel Haisley, states patient recently saw Dr. Olivia Mackie McLean-Scocuzza for a sinus infection.  Butch Penny states Dr. Terese Door prescribed a z-pack for patient and asked him to let her know if he is still having issues after completion of the z-pack.  Butch Penny states patient has completed the z-pack, but still has the sinus infection.  Butch Penny states [facility] [address].  Butch Penny asked that we please delete the other [facility] that we [facility] on record.

## 2022-06-04 ENCOUNTER — Encounter: Payer: Self-pay | Admitting: Internal Medicine

## 2022-06-04 ENCOUNTER — Other Ambulatory Visit: Payer: Self-pay | Admitting: Internal Medicine

## 2022-06-04 DIAGNOSIS — J0110 Acute frontal sinusitis, unspecified: Secondary | ICD-10-CM

## 2022-06-04 MED ORDER — LEVOFLOXACIN 750 MG PO TABS: 750.0000 mg | ORAL_TABLET | Freq: Every day | ORAL | 0 refills | Status: DC

## 2022-06-05 ENCOUNTER — Ambulatory Visit: Payer: Medicare HMO | Admitting: Internal Medicine

## 2022-06-16 NOTE — Telephone Encounter (Signed)
Per Butch Penny, patient has finished 2 rounds of antibiotics and is still having the same issues.

## 2022-06-17 NOTE — Addendum Note (Signed)
Addended by: Orland Mustard on: 06/17/2022 11:11 AM   Modules accepted: Orders

## 2022-06-17 NOTE — Telephone Encounter (Signed)
Pt wife called regarding medication for pt

## 2022-07-14 DIAGNOSIS — J328 Other chronic sinusitis: Secondary | ICD-10-CM | POA: Diagnosis not present

## 2022-07-14 DIAGNOSIS — J302 Other seasonal allergic rhinitis: Secondary | ICD-10-CM | POA: Diagnosis not present

## 2022-07-23 ENCOUNTER — Telehealth: Payer: Self-pay

## 2022-07-23 NOTE — Telephone Encounter (Signed)
Patient's wife, Clif Serio, called to state patient is going to [place] on Monday (07/27/2022) to get set up and he needs to take his vaccination records with him.  Butch Penny states she would like to stop by tomorrow to pick up patient's vaccination records.  Butch Penny asks that we please call her when they are ready.

## 2022-07-24 NOTE — Telephone Encounter (Signed)
I called the patient's wife and informed her that the patients vaccinations records were printed and ready for pickup at the front desk, she understood. Vylette Strubel,cma

## 2022-07-27 LAB — BASIC METABOLIC PANEL
BUN: 21 (ref 4–21)
CO2: 31 — AB (ref 13–22)
Chloride: 104 (ref 99–108)
Creatinine: 1.1 (ref <=1.3)
Glucose: 96
Potassium: 4 mEq/L (ref 3.5–5.1)

## 2022-07-27 LAB — COMPREHENSIVE METABOLIC PANEL
Calcium: 9.3 (ref 8.7–10.7)
eGFR: 69

## 2022-07-27 LAB — LIPID PANEL
Cholesterol: 137 (ref 0–200)
HDL: 68 (ref 35–70)
LDL Cholesterol: 57
Triglycerides: 124 (ref 40–160)

## 2022-07-27 LAB — CBC AND DIFFERENTIAL
HCT: 45 (ref 41–53)
Hemoglobin: 15.3 (ref 13.5–17.5)
Platelets: 229 10*3/uL (ref 150–400)
WBC: 9.5

## 2022-08-04 ENCOUNTER — Telehealth: Payer: Self-pay | Admitting: Internal Medicine

## 2022-08-04 NOTE — Telephone Encounter (Signed)
I can work him in tomorrow at 1:00

## 2022-08-04 NOTE — Telephone Encounter (Signed)
Spoke with pt and he stated that he would be here tomorrow at 1 pm.

## 2022-08-04 NOTE — Telephone Encounter (Signed)
Letter from wife about patient is up front in Dr [employer].

## 2022-08-05 ENCOUNTER — Ambulatory Visit (INDEPENDENT_AMBULATORY_CARE_PROVIDER_SITE_OTHER): Payer: Medicare HMO | Admitting: Internal Medicine

## 2022-08-05 ENCOUNTER — Telehealth: Payer: Self-pay | Admitting: Internal Medicine

## 2022-08-05 ENCOUNTER — Encounter: Payer: Self-pay | Admitting: Internal Medicine

## 2022-08-05 VITALS — BP 100/60 | HR 107 | Temp 97.8°F | Ht 70.0 in | Wt 163.6 lb

## 2022-08-05 DIAGNOSIS — Z862 Personal history of diseases of the blood and blood-forming organs and certain disorders involving the immune mechanism: Secondary | ICD-10-CM

## 2022-08-05 DIAGNOSIS — R42 Dizziness and giddiness: Secondary | ICD-10-CM

## 2022-08-05 DIAGNOSIS — J329 Chronic sinusitis, unspecified: Secondary | ICD-10-CM | POA: Diagnosis not present

## 2022-08-05 DIAGNOSIS — E7800 Pure hypercholesterolemia, unspecified: Secondary | ICD-10-CM

## 2022-08-05 DIAGNOSIS — M79673 Pain in unspecified foot: Secondary | ICD-10-CM | POA: Insufficient documentation

## 2022-08-05 DIAGNOSIS — J302 Other seasonal allergic rhinitis: Secondary | ICD-10-CM | POA: Insufficient documentation

## 2022-08-05 NOTE — Telephone Encounter (Signed)
Reviewed.  Cholesterol - LDL 56.  Glucose 96

## 2022-08-05 NOTE — Telephone Encounter (Signed)
Labs were abstracted and put in the medical records folder for your review.  Challen Spainhour,cma

## 2022-08-05 NOTE — Telephone Encounter (Signed)
Pt spouse came into the office to drop off [place] blood work. Placed in provider folder

## 2022-08-05 NOTE — Progress Notes (Signed)
[name], male   DOB: November 30, 1946, 76 y.o.   MRN: [id number]   Subjective:    [name] [facility], male    DOB: 04/01/1946, 76 y.o.   MRN: [id number]   Patient here for work in appt.  Chief Complaint  Patient presents with   Follow-up    Off balance/ sinus headache X months   .   HPI Reports starting in June - mowing - noticed symptoms he thought were consistent with sinus issues.  Noticed some minimal drainage.  Some light headedness.  Evaluated by Dr [facility].  Treated with zpak and levaquin.  Persistent symptoms - "head felt funny".  Referred to ENT.  ENT placed him on prednisone.  Still with persistent symptoms.  Some nasal congestion and occasional drainage.  Persistent light headed sensation.  Feels off.  Head does not feel right.  No chest pain.  Breathing stable.  No increased headache.  Is s/u cataract surgery.  Discussed f/u with eye md.     Past Medical History:  Diagnosis Date   Anemia    Cancer ([place])    skin   Hypercholesterolemia    Past Surgical History:  Procedure Laterality Date   cataract surgery     COLONOSCOPY WITH PROPOFOL N/A 01/06/2016   Procedure: COLONOSCOPY WITH PROPOFOL;  Surgeon: Lollie Sails, MD;  Location: [facility] ENDOSCOPY;  Service: Endoscopy;  Laterality: N/A;   HERNIA REPAIR  11/30/1981   HERNIA REPAIR     INGUINAL HERNIA REPAIR Right 06/25/2015   Procedure: HERNIA REPAIR INGUINAL ADULT;  Surgeon: Leonie Green, MD;  Location: [facility] ORS;  Service: General;  Laterality: Right;   plate in rt forearm Right    Rod placed Rt leg Right    SKIN CANCER EXCISION  02/28/2014   BCCA-shoulder   Family History  Problem Relation Age of Onset   Diabetes Mother    Diabetes Brother    Diabetes Brother    Prostate cancer Neg Hx    Colon cancer Neg Hx    Social History   Socioeconomic History   Marital status: Married    Spouse name: Not on file   Number of children: 2   Years of education: Not on file   Highest  education level: Not on file  Occupational History   Not on file  Tobacco Use   Smoking status: Never   Smokeless tobacco: Never  Substance and Sexual Activity   Alcohol use: Yes    Alcohol/week: 0.0 standard drinks of alcohol    Comment: rare   Drug use: No   Sexual activity: Yes  Other Topics Concern   Not on file  Social History Narrative   Not on file   Social Determinants of Health   Financial Resource Strain: Low Risk  (05/08/2022)   Overall Financial Resource Strain (CARDIA)    Difficulty of Paying Living Expenses: Not hard at all  Food Insecurity: No Food Insecurity (05/08/2022)   Hunger Vital Sign    Worried About Running Out of Food in the Last Year: Never true    Ran Out of Food in the Last Year: Never true  Transportation Needs: No Transportation Needs (05/08/2022)   PRAPARE - Hydrologist (Medical): No    Lack of Transportation (Non-Medical): No  Physical Activity: Sufficiently Active (05/08/2022)   Exercise Vital Sign    Days of Exercise per Week: 3 days    Minutes of Exercise per Session: 60 min  Stress: No Stress Concern Present (05/08/2022)   [place]    Feeling of Stress : Not at all  Social Connections: Unknown (05/08/2022)   Social Connection and Isolation Panel [NHANES]    Frequency of Communication with Friends and Family: More than three times a week    Frequency of Social Gatherings with Friends and Family: More than three times a week    Attends Religious Services: Not on Advertising copywriter or Organizations: Not on file    Attends Archivist Meetings: Not on file    Marital Status: Married     Review of Systems  Constitutional:  Negative for appetite change and unexpected weight change.  HENT:  Positive for postnasal drip.        Some minimal congestion and drainage as outlined.    Respiratory:  Negative for cough, chest tightness and  shortness of breath.   Cardiovascular:  Negative for chest pain, palpitations and leg swelling.  Gastrointestinal:  Negative for abdominal pain, diarrhea and vomiting.  Genitourinary:  Negative for difficulty urinating and dysuria.  Musculoskeletal:  Negative for joint swelling and myalgias.  Skin:  Negative for color change and rash.  Neurological:  Positive for light-headedness. Negative for headaches.  Psychiatric/Behavioral:  Negative for agitation and dysphoric mood.        Objective:     BP 100/60 (BP Location: Left Arm, Patient Position: Sitting, Cuff Size: Normal)   Pulse (!) 107   Temp 97.8 F (36.6 C) (Oral)   Ht '5\' 10"'$  (1.778 m)   Wt 163 lb 9.6 oz (74.2 kg)   SpO2 94%   BMI 23.47 kg/m  Wt Readings from Last 3 Encounters:  08/05/22 163 lb 9.6 oz (74.2 kg)  05/14/22 163 lb 9.6 oz (74.2 kg)  05/08/22 164 lb (74.4 kg)    Physical Exam Constitutional:      General: He is not in acute distress.    Appearance: Normal appearance. He is well-developed.  HENT:     Head: Normocephalic and atraumatic.     Right Ear: External ear normal.     Left Ear: External ear normal.  Eyes:     General: No scleral icterus.       Right eye: No discharge.        Left eye: No discharge.  Cardiovascular:     Rate and Rhythm: Normal rate and regular rhythm.  Pulmonary:     Effort: Pulmonary effort is normal. No respiratory distress.     Breath sounds: Normal breath sounds.  Abdominal:     General: Bowel sounds are normal.     Palpations: Abdomen is soft.     Tenderness: There is no abdominal tenderness.  Musculoskeletal:        General: No swelling or tenderness.     Cervical back: Neck supple. No tenderness.  Lymphadenopathy:     Cervical: No cervical adenopathy.  Skin:    Findings: No erythema or rash.  Neurological:     Mental Status: He is alert.  Psychiatric:        Mood and Affect: Mood normal.        Behavior: Behavior normal.      Outpatient Encounter  Medications as of 08/05/2022  Medication Sig   cetirizine (ZYRTEC) 10 MG tablet Take 1 tablet (10 mg total) by mouth daily as needed for allergies.   fluticasone (FLONASE) 50 MCG/ACT nasal spray PLACE 2 SPRAYS  INTO BOTH NOSTRILS DAILY AS NEEDED   levofloxacin (LEVAQUIN) 750 MG tablet Take 1 tablet (750 mg total) by mouth daily. With food   rosuvastatin (CRESTOR) 5 MG tablet Take 1 tablet (5 mg total) by mouth daily. D/c 10   sodium chloride (OCEAN) 0.65 % SOLN nasal spray Place 2 sprays into both nostrils daily.   No facility-administered encounter medications on file as of 08/05/2022.     Lab Results  Component Value Date   WBC 9.5 07/27/2022   HGB 15.3 07/27/2022   HCT 45 07/27/2022   PLT 229 07/27/2022   GLUCOSE 94 11/03/2021   CHOL 137 07/27/2022   TRIG 124 07/27/2022   HDL 68 07/27/2022   LDLCALC 57 07/27/2022   ALT 14 09/23/2021   AST 18 09/23/2021   NA 141 11/03/2021   K 4.0 07/27/2022   CL 104 07/27/2022   CREATININE 1.1 07/27/2022   BUN 21 07/27/2022   CO2 31 (A) 07/27/2022   TSH 2.96 09/23/2021   PSA 1.32 10/02/2021      Assessment & Plan:   Problem List Items Addressed This Visit     History of anemia    Follow cbc.       Hypercholesterolemia    Low cholesterol diet and exercise.  On crestor.  Follow lipid panel and liver function tests.        Light headedness    Recently treated for sinus infection as outlined.  ENT - prescribed prednisone.  Persistent feeling - head doesn't feel right.  Not orthostatic on exam.  No chest pain or sob.  EKG - SR with no acute changes noted.  Discussed need for f/u with ophthalmology to confirm vision/eyes not contributing.  Given persistence, obtain MRI Brain to further evaluate.        Relevant Orders   MR Brain W Wo Contrast   Sinusitis    Recently treated for sinus infection with zpak and levaquin.  Also saw ENT - prescribed prednisone.  Has f/u with ENT. Pursue further w/up as outlined for light headed sensation.        Other Visit Diagnoses     Dizziness    -  Primary   Relevant Orders   EKG 12-Lead (Completed)   MR Brain W Wo Contrast        Einar Pheasant, MD

## 2022-08-10 ENCOUNTER — Encounter: Payer: Self-pay | Admitting: Internal Medicine

## 2022-08-10 DIAGNOSIS — R42 Dizziness and giddiness: Secondary | ICD-10-CM | POA: Insufficient documentation

## 2022-08-10 DIAGNOSIS — G43101 Migraine with aura, not intractable, with status migrainosus: Secondary | ICD-10-CM | POA: Diagnosis not present

## 2022-08-10 NOTE — Assessment & Plan Note (Signed)
Low cholesterol diet and exercise.  On crestor.  Follow lipid panel and liver function tests.  

## 2022-08-10 NOTE — Assessment & Plan Note (Signed)
Recently treated for sinus infection with zpak and levaquin.  Also saw ENT - prescribed prednisone.  Has f/u with ENT. Pursue further w/up as outlined for light headed sensation.

## 2022-08-10 NOTE — Assessment & Plan Note (Signed)
Recently treated for sinus infection as outlined.  ENT - prescribed prednisone.  Persistent feeling - head doesn't feel right.  Not orthostatic on exam.  No chest pain or sob.  EKG - SR with no acute changes noted.  Discussed need for f/u with ophthalmology to confirm vision/eyes not contributing.  Given persistence, obtain MRI Brain to further evaluate.

## 2022-08-10 NOTE — Assessment & Plan Note (Signed)
Follow cbc.  

## 2022-08-11 DIAGNOSIS — J302 Other seasonal allergic rhinitis: Secondary | ICD-10-CM | POA: Diagnosis not present

## 2022-08-11 DIAGNOSIS — R42 Dizziness and giddiness: Secondary | ICD-10-CM | POA: Diagnosis not present

## 2022-08-12 ENCOUNTER — Ambulatory Visit: Payer: Medicare HMO | Admitting: Podiatry

## 2022-08-12 ENCOUNTER — Encounter: Payer: Self-pay | Admitting: Podiatry

## 2022-08-12 DIAGNOSIS — L84 Corns and callosities: Secondary | ICD-10-CM

## 2022-08-12 DIAGNOSIS — M7742 Metatarsalgia, left foot: Secondary | ICD-10-CM

## 2022-08-12 DIAGNOSIS — M7741 Metatarsalgia, right foot: Secondary | ICD-10-CM

## 2022-08-12 NOTE — Progress Notes (Signed)
  Subjective:  [name], male    DOB: [birth year],  MRN: [id number]  Chief Complaint  Patient presents with   Callouses     New pt- callous that wants looked at very painful both feet outside    76 y.o. male presents with the above complaint. History confirmed with patient.  The painful area is under the fifth toe on the outside of each foot  Objective:  Physical Exam: warm, good capillary refill, no trophic changes or ulcerative lesions, normal DP and PT pulses, normal sensory exam, and painful hyperkeratosis submetatarsal 5 bilateral.  Prominence of the metatarsal head here  Assessment:   1. Callus of foot   2. Metatarsalgia of both feet      Plan:  Patient was evaluated and treated and all questions answered.  We discussed etiology and treatment options of hyperkeratotic lesions in this particular area.  We discussed offloading with dancers pads which I dispensed today.  I also recommended using cream such as urea cream with salicylic acid and he will get this online.  I debrided the lesion as a courtesy today.  He will return as needed if this does not improve  Return if symptoms worsen or fail to improve.

## 2022-08-12 NOTE — Patient Instructions (Signed)
Look for urea 40% +2% urea cream or ointment and apply to the thickened dry skin / calluses. This can be bought over the counter, at a pharmacy or online such as [employer].

## 2022-08-13 ENCOUNTER — Telehealth: Payer: Self-pay | Admitting: Internal Medicine

## 2022-08-13 NOTE — Telephone Encounter (Signed)
Pt spouse came into the office to drop off a letter for the provider about pt condition. Placed up front in folder

## 2022-08-14 NOTE — Telephone Encounter (Signed)
Reviewed.  Wife providing update on pt.  MRI scheduled.

## 2022-08-17 ENCOUNTER — Ambulatory Visit
Admission: RE | Admit: 2022-08-17 | Discharge: 2022-08-17 | Disposition: A | Discharge status: Home / Self Care | Payer: Medicare HMO | Source: Ambulatory Visit | Attending: Internal Medicine | Admitting: Internal Medicine

## 2022-08-17 DIAGNOSIS — R42 Dizziness and giddiness: Secondary | ICD-10-CM | POA: Diagnosis not present

## 2022-08-17 DIAGNOSIS — G319 Degenerative disease of nervous system, unspecified: Secondary | ICD-10-CM | POA: Diagnosis not present

## 2022-08-17 MED ORDER — GADOBUTROL 1 MMOL/ML IV SOLN
7.5000 mL | Freq: Once | INTRAVENOUS | Status: AC | PRN
Start: 2022-08-17 — End: 2022-08-17
  Administered 2022-08-17: 7.5 mL via INTRAVENOUS

## 2022-08-18 NOTE — Telephone Encounter (Signed)
Pt spouse called stating pt had MRI and the results are in but they don't have Internet so they don't know what they are.Pt has appointment with Dr. Brigitte Pulse on nov 28

## 2022-08-26 NOTE — Telephone Encounter (Signed)
Patient has more instructions and questions.  She requested to talk with the nurse.

## 2022-08-26 NOTE — Telephone Encounter (Signed)
I spoke with patient himself & he stated that he already was on cancellation list. I advised that unfortunately that other than that they were just booked out this far as many specialty offices were & if referred elsewhere could take just as long. Pt also did not want to start over with new provider.

## 2022-08-26 NOTE — Telephone Encounter (Signed)
Pt spouse called wanting to know if the provider has anyone she can recommend the pt to so that they can get an earlier appointment than 11/28

## 2022-08-27 ENCOUNTER — Telehealth: Payer: Self-pay | Admitting: Internal Medicine

## 2022-08-27 NOTE — Telephone Encounter (Signed)
Patient dropped a note for Dr Nicki Reaper. Note is up front in Dr [employer] color folder.

## 2022-08-27 NOTE — Telephone Encounter (Signed)
As per conversation yesterday with Judson Roch, patient wanted to give fax # and what he needs to be fax to [place]. Patient did not want to give front office the information, per patient Judson Roch knows what they talked about.

## 2022-08-28 NOTE — Telephone Encounter (Signed)
All notes and MRI results were faxed to Dr. Josetta Huddle at [facility] primary care per patient request.  Confirmation given.

## 2022-08-28 NOTE — Telephone Encounter (Signed)
Reviewed.  Note placed in box at [employer].  Requesting medical records (MRI scan, etc) be sent to [facility]

## 2022-09-02 NOTE — Telephone Encounter (Signed)
I called and spoke with patient & he stated that he had needed MRI he had done sent over to [place]. However, someone from our office had already faxed & he had all that was needed. He is having a CT done today at [place] of his sinuses to see if it reveals any cause of his nausea & dizziness.

## 2022-09-21 ENCOUNTER — Ambulatory Visit (INDEPENDENT_AMBULATORY_CARE_PROVIDER_SITE_OTHER): Payer: Medicare HMO

## 2022-09-21 DIAGNOSIS — Z23 Encounter for immunization: Secondary | ICD-10-CM

## 2022-09-22 DIAGNOSIS — D044 Carcinoma in situ of skin of scalp and neck: Secondary | ICD-10-CM | POA: Diagnosis not present

## 2022-09-22 DIAGNOSIS — L570 Actinic keratosis: Secondary | ICD-10-CM | POA: Diagnosis not present

## 2022-09-22 DIAGNOSIS — D485 Neoplasm of uncertain behavior of skin: Secondary | ICD-10-CM | POA: Diagnosis not present

## 2022-10-14 ENCOUNTER — Encounter: Payer: Medicare HMO | Admitting: Internal Medicine

## 2022-10-27 DIAGNOSIS — G43719 Chronic migraine without aura, intractable, without status migrainosus: Secondary | ICD-10-CM | POA: Diagnosis not present

## 2022-10-27 DIAGNOSIS — G939 Disorder of brain, unspecified: Secondary | ICD-10-CM | POA: Diagnosis not present

## 2022-10-27 DIAGNOSIS — R42 Dizziness and giddiness: Secondary | ICD-10-CM | POA: Diagnosis not present

## 2022-10-27 DIAGNOSIS — E538 Deficiency of other specified B group vitamins: Secondary | ICD-10-CM | POA: Diagnosis not present

## 2022-11-02 ENCOUNTER — Ambulatory Visit (INDEPENDENT_AMBULATORY_CARE_PROVIDER_SITE_OTHER): Payer: Medicare HMO | Admitting: Internal Medicine

## 2022-11-02 ENCOUNTER — Encounter: Payer: Self-pay | Admitting: Internal Medicine

## 2022-11-02 VITALS — BP 126/76 | HR 65 | Temp 98.3°F | Resp 17 | Ht 71.0 in | Wt 168.8 lb

## 2022-11-02 DIAGNOSIS — Z125 Encounter for screening for malignant neoplasm of prostate: Secondary | ICD-10-CM

## 2022-11-02 DIAGNOSIS — Z0000 Encounter for general adult medical examination without abnormal findings: Secondary | ICD-10-CM | POA: Diagnosis not present

## 2022-11-02 DIAGNOSIS — Z1211 Encounter for screening for malignant neoplasm of colon: Secondary | ICD-10-CM | POA: Diagnosis not present

## 2022-11-02 DIAGNOSIS — R42 Dizziness and giddiness: Secondary | ICD-10-CM | POA: Diagnosis not present

## 2022-11-02 DIAGNOSIS — Z86010 Personal history of colonic polyps: Secondary | ICD-10-CM | POA: Diagnosis not present

## 2022-11-02 DIAGNOSIS — Z862 Personal history of diseases of the blood and blood-forming organs and certain disorders involving the immune mechanism: Secondary | ICD-10-CM | POA: Diagnosis not present

## 2022-11-02 DIAGNOSIS — E7800 Pure hypercholesterolemia, unspecified: Secondary | ICD-10-CM

## 2022-11-02 LAB — LIPID PANEL
Cholesterol: 146 mg/dL (ref 0–200)
HDL: 46 mg/dL (ref >39.00)
LDL Cholesterol: 71 mg/dL (ref 0–99)
NonHDL: 100.19
Total CHOL/HDL Ratio: 3
Triglycerides: 145 mg/dL (ref 0.0–149.0)
VLDL: 29 mg/dL (ref 0.0–40.0)

## 2022-11-02 LAB — BASIC METABOLIC PANEL
BUN: 16 mg/dL (ref 6–23)
CO2: 29 mEq/L (ref 19–32)
Calcium: 9.1 mg/dL (ref 8.4–10.5)
Chloride: 103 mEq/L (ref 96–112)
Creatinine, Ser: 1.13 mg/dL (ref 0.40–1.50)
GFR: 62.54 mL/min (ref >60.00)
Glucose, Bld: 97 mg/dL (ref 70–99)
Potassium: 4 mEq/L (ref 3.5–5.1)
Sodium: 140 mEq/L (ref 135–145)

## 2022-11-02 LAB — HEPATIC FUNCTION PANEL
ALT: 15 U/L (ref 0–53)
AST: 18 U/L (ref 0–37)
Albumin: 4.5 g/dL (ref 3.5–5.2)
Alkaline Phosphatase: 39 U/L (ref 39–117)
Bilirubin, Direct: 0.1 mg/dL (ref 0.0–0.3)
Total Bilirubin: 0.6 mg/dL (ref 0.2–1.2)
Total Protein: 6.8 g/dL (ref 6.0–8.3)

## 2022-11-02 LAB — PSA, MEDICARE: PSA: 1.3 ng/ml (ref 0.10–4.00)

## 2022-11-02 NOTE — Progress Notes (Signed)
[name], male   DOB: Apr 20, 1946, 76 y.o.   MRN: [id number]   Subjective:    [name] [facility], male    DOB: [birth year], 76 y.o.   MRN: [id number]   Patient here for a physical exam. .   HPI Here for physical exam.  Has been having issues with light headedness/"feeling off".  MRI brain 08/17/22 - no acute abnormality.  Saw ENT and ophthalmology. Saw Dr Manuella Ghazi 10/27/22.   Symptoms felt to be c/w migraine. Recommended magnesium and continuing crestor.  Started magnesium.  Stays active.  No chest pain or sob reported.  No abdominal pain or bowel change reported.  Handling stress.     Past Medical History:  Diagnosis Date   Anemia    Cancer ([place])    skin   Hypercholesterolemia    Past Surgical History:  Procedure Laterality Date   cataract surgery     COLONOSCOPY WITH PROPOFOL N/A 01/06/2016   Procedure: COLONOSCOPY WITH PROPOFOL;  Surgeon: Lollie Sails, MD;  Location: [facility] ENDOSCOPY;  Service: Endoscopy;  Laterality: N/A;   HERNIA REPAIR  11/30/1981   HERNIA REPAIR     INGUINAL HERNIA REPAIR Right 06/25/2015   Procedure: HERNIA REPAIR INGUINAL ADULT;  Surgeon: Leonie Green, MD;  Location: [facility] ORS;  Service: General;  Laterality: Right;   plate in rt forearm Right    Rod placed Rt leg Right    SKIN CANCER EXCISION  02/28/2014   BCCA-shoulder   Family History  Problem Relation Age of Onset   Diabetes Mother    Diabetes Brother    Diabetes Brother    Prostate cancer Neg Hx    Colon cancer Neg Hx    Social History   Socioeconomic History   Marital status: Married    Spouse name: Not on file   Number of children: 2   Years of education: Not on file   Highest education level: Not on file  Occupational History   Not on file  Tobacco Use   Smoking status: Never   Smokeless tobacco: Never  Substance and Sexual Activity   Alcohol use: Yes    Alcohol/week: 0.0 standard drinks of alcohol    Comment: rare   Drug use: No   Sexual  activity: Yes  Other Topics Concern   Not on file  Social History Narrative   Not on file   Social Determinants of Health   Financial Resource Strain: Low Risk  (05/08/2022)   Overall Financial Resource Strain (CARDIA)    Difficulty of Paying Living Expenses: Not hard at all  Food Insecurity: No Food Insecurity (05/08/2022)   Hunger Vital Sign    Worried About Running Out of Food in the Last Year: Never true    Ran Out of Food in the Last Year: Never true  Transportation Needs: No Transportation Needs (05/08/2022)   PRAPARE - Hydrologist (Medical): No    Lack of Transportation (Non-Medical): No  Physical Activity: Sufficiently Active (05/08/2022)   Exercise Vital Sign    Days of Exercise per Week: 3 days    Minutes of Exercise per Session: 60 min  Stress: No Stress Concern Present (05/08/2022)   [place]    Feeling of Stress : Not at all  Social Connections: Unknown (05/08/2022)   Social Connection and Isolation Panel [NHANES]    Frequency of Communication with Friends and Family: More than three times  a week    Frequency of Social Gatherings with Friends and Family: More than three times a week    Attends Religious Services: Not on file    Active Member of Clubs or Organizations: Not on file    Attends Archivist Meetings: Not on file    Marital Status: Married     Review of Systems  Constitutional:  Negative for appetite change and unexpected weight change.  HENT:  Negative for congestion, sinus pressure and sore throat.   Eyes:  Negative for pain and visual disturbance.  Respiratory:  Negative for cough, chest tightness and shortness of breath.   Cardiovascular:  Negative for chest pain, palpitations and leg swelling.  Gastrointestinal:  Negative for abdominal pain, diarrhea, nausea and vomiting.  Genitourinary:  Negative for difficulty urinating and dysuria.   Musculoskeletal:  Negative for joint swelling and myalgias.  Skin:  Negative for color change and rash.  Neurological:  Negative for dizziness, light-headedness and headaches.  Hematological:  Negative for adenopathy. Does not bruise/bleed easily.  Psychiatric/Behavioral:  Negative for agitation and dysphoric mood.        Objective:     BP 126/76 (BP Location: Left Arm, Patient Position: Sitting, Cuff Size: Small)   Pulse 65   Temp 98.3 F (36.8 C) (Temporal)   Resp 17   Ht '5\' 11"'$  (1.803 m)   Wt 168 lb 12.8 oz (76.6 kg)   SpO2 99%   BMI 23.54 kg/m  Wt Readings from Last 3 Encounters:  11/02/22 168 lb 12.8 oz (76.6 kg)  08/05/22 163 lb 9.6 oz (74.2 kg)  05/14/22 163 lb 9.6 oz (74.2 kg)    Physical Exam Constitutional:      General: He is not in acute distress.    Appearance: Normal appearance. He is well-developed.  HENT:     Head: Normocephalic and atraumatic.     Right Ear: External ear normal.     Left Ear: External ear normal.  Eyes:     General: No scleral icterus.       Right eye: No discharge.        Left eye: No discharge.     Conjunctiva/sclera: Conjunctivae normal.  Neck:     Thyroid: No thyromegaly.  Cardiovascular:     Rate and Rhythm: Normal rate and regular rhythm.  Pulmonary:     Effort: No respiratory distress.     Breath sounds: Normal breath sounds. No wheezing.  Abdominal:     General: Bowel sounds are normal.     Palpations: Abdomen is soft.     Tenderness: There is no abdominal tenderness.  Musculoskeletal:        General: No swelling or tenderness.     Cervical back: Neck supple. No tenderness.  Lymphadenopathy:     Cervical: No cervical adenopathy.  Skin:    Findings: No erythema or rash.  Neurological:     Mental Status: He is alert and oriented to person, place, and time.  Psychiatric:        Mood and Affect: Mood normal.        Behavior: Behavior normal.      Outpatient Encounter Medications as of 11/02/2022  Medication Sig    fluticasone (FLONASE) 50 MCG/ACT nasal spray PLACE 2 SPRAYS INTO BOTH NOSTRILS DAILY AS NEEDED   Magnesium 400 MG TABS Take by mouth.   rosuvastatin (CRESTOR) 5 MG tablet Take 1 tablet (5 mg total) by mouth daily. D/c 10   sodium chloride (OCEAN) 0.65 %  SOLN nasal spray Place 2 sprays into both nostrils daily.   [DISCONTINUED] cetirizine (ZYRTEC) 10 MG tablet Take 1 tablet (10 mg total) by mouth daily as needed for allergies.   [DISCONTINUED] levofloxacin (LEVAQUIN) 750 MG tablet Take 1 tablet (750 mg total) by mouth daily. With food   No facility-administered encounter medications on file as of 11/02/2022.     Lab Results  Component Value Date   WBC 9.5 07/27/2022   HGB 15.3 07/27/2022   HCT 45 07/27/2022   PLT 229 07/27/2022   GLUCOSE 97 11/02/2022   CHOL 146 11/02/2022   TRIG 145.0 11/02/2022   HDL 46.00 11/02/2022   LDLCALC 71 11/02/2022   ALT 15 11/02/2022   AST 18 11/02/2022   NA 140 11/02/2022   K 4.0 11/02/2022   CL 103 11/02/2022   CREATININE 1.13 11/02/2022   BUN 16 11/02/2022   CO2 29 11/02/2022   TSH 2.96 09/23/2021   PSA 1.30 11/02/2022    MR Brain W Wo Contrast  Result Date: 08/17/2022 CLINICAL DATA:  Provided history: Lightheadedness. Dizziness, nonspecific. Additional history provided by scanning technologist: Patient reports intermittent dizziness throughout the day, symptoms since June. EXAM: MRI HEAD WITHOUT AND WITH CONTRAST TECHNIQUE: Multiplanar, multiecho pulse sequences of the brain and surrounding structures were obtained without and with intravenous contrast. CONTRAST:  7.53m GADAVIST GADOBUTROL 1 MMOL/ML IV SOLN COMPARISON:  No pertinent prior exams available for comparison. FINDINGS: Brain: Mild generalized cerebral atrophy. Multifocal T2 FLAIR hyperintense signal abnormality within the cerebral white matter, nonspecific but compatible with mild chronic small vessel ischemic disease. There is no acute infarct. No evidence of an intracranial mass. No  chronic intracranial blood products. No extra-axial fluid collection. No midline shift. No pathologic intracranial enhancement identified. Vascular: Maintained flow voids within the proximal large arterial vessels. Skull and upper cervical spine: No focal suspicious marrow lesion. Sinuses/Orbits: No mass or acute finding within the imaged orbits. Prior bilateral ocular lens replacement. No significant paranasal sinus disease. IMPRESSION: No evidence of acute intracranial abnormality. Mild chronic small vessel ischemic changes within the cerebral white matter. Mild generalized cerebral atrophy. Electronically Signed   By: KKellie SimmeringD.O.   On: 08/17/2022 17:39       Assessment & Plan:   Problem List Items Addressed This Visit     Health care maintenance    Physical today 11/02/22.  Check psa today.  Colonoscopy 01/2016.  Recommended f/u in 5 years.  Agreeable for referral.       History of anemia    Follow cbc.       History of colonic polyps    Colonoscopy 01/2016 as outlined in overview.  Recheck 5 years.  Agreeable for referral.       Relevant Orders   Ambulatory referral to Gastroenterology   Hypercholesterolemia    Low cholesterol diet and exercise.  On crestor.  Follow lipid panel and liver function tests.        Relevant Orders   Hepatic function panel (Completed)   Basic metabolic panel (Completed)   Lipid panel (Completed)   Light headedness     Has been having issues with light headedness/"feeling off".  MRI brain 08/17/22 - no acute abnormality.  Saw ENT and ophthalmology. Saw Dr SManuella Ghazi11/28/23.   Symptoms felt to be c/w migraine. Recommended magnesium and continuing crestor.  Started magnesium.        Other Visit Diagnoses     Routine general medical examination at a health care facility    -  Primary   Prostate cancer screening       Relevant Orders   PSA, Medicare (Completed)   Colon cancer screening       Relevant Orders   Ambulatory referral to Gastroenterology         Einar Pheasant, MD

## 2022-11-02 NOTE — Assessment & Plan Note (Addendum)
Physical today 11/02/22.  Check psa today.  Colonoscopy 01/2016.  Recommended f/u in 5 years.  Agreeable for referral.

## 2022-11-08 ENCOUNTER — Encounter: Payer: Self-pay | Admitting: Internal Medicine

## 2022-11-08 NOTE — Assessment & Plan Note (Signed)
Colonoscopy 01/2016 as outlined in overview.  Recheck 5 years.  Agreeable for referral.

## 2022-11-08 NOTE — Assessment & Plan Note (Signed)
Follow cbc.  

## 2022-11-08 NOTE — Assessment & Plan Note (Signed)
Low cholesterol diet and exercise.  On crestor.  Follow lipid panel and liver function tests.  

## 2022-11-08 NOTE — Assessment & Plan Note (Signed)
Has been having issues with light headedness/"feeling off".  MRI brain 08/17/22 - no acute abnormality.  Saw ENT and ophthalmology. Saw Dr Manuella Ghazi 10/27/22.   Symptoms felt to be c/w migraine. Recommended magnesium and continuing crestor.  Started magnesium.

## 2022-11-30 DIAGNOSIS — R519 Headache, unspecified: Secondary | ICD-10-CM

## 2022-11-30 HISTORY — DX: Headache, unspecified: R51.9

## 2023-03-25 ENCOUNTER — Encounter: Payer: Self-pay | Admitting: Internal Medicine

## 2023-03-25 ENCOUNTER — Ambulatory Visit (INDEPENDENT_AMBULATORY_CARE_PROVIDER_SITE_OTHER): Payer: Medicare Other | Admitting: Internal Medicine

## 2023-03-25 VITALS — BP 110/70 | HR 87 | Temp 98.1°F | Resp 16 | Ht 70.0 in | Wt 163.0 lb

## 2023-03-25 DIAGNOSIS — J0110 Acute frontal sinusitis, unspecified: Secondary | ICD-10-CM | POA: Diagnosis not present

## 2023-03-25 DIAGNOSIS — K4090 Unilateral inguinal hernia, without obstruction or gangrene, not specified as recurrent: Secondary | ICD-10-CM

## 2023-03-25 DIAGNOSIS — E7800 Pure hypercholesterolemia, unspecified: Secondary | ICD-10-CM | POA: Diagnosis not present

## 2023-03-25 DIAGNOSIS — E785 Hyperlipidemia, unspecified: Secondary | ICD-10-CM | POA: Diagnosis not present

## 2023-03-25 MED ORDER — FLUTICASONE PROPIONATE 50 MCG/ACT NA SUSP: NASAL | 1 refills | Status: DC

## 2023-03-25 MED ORDER — ROSUVASTATIN CALCIUM 5 MG PO TABS
5.0000 mg | ORAL_TABLET | Freq: Every day | ORAL | 3 refills | Status: DC
Start: 2023-03-25 — End: 2024-02-02

## 2023-03-25 NOTE — Progress Notes (Signed)
Subjective:    [name], male    DOB: 06-Jun-1946, 77 y.o.   MRN: [id number]  Patient here for work in appt  HPI Work in appt - reported swelling - groin.  Has a history of right inguinal hernia.  Is s/p repair x 2.  Right doing well.  Noticed 3-4 days ago, different sensation - left inguinal area.  No pain.  Notices more when standing or after being up for a while.  Bowels are moving.  No abdominal pain.  No urine change.  Of note, seeing neurology - migraine. Vitamin D low and B12 low. Replaced.  Instructed to take magnesium glycinate.    Past Medical History:  Diagnosis Date   Anemia    Cancer    skin   Hypercholesterolemia    Past Surgical History:  Procedure Laterality Date   cataract surgery     COLONOSCOPY WITH PROPOFOL N/A 01/06/2016   Procedure: COLONOSCOPY WITH PROPOFOL;  Surgeon: Christena Deem, MD;  Location: [facility] ENDOSCOPY;  Service: Endoscopy;  Laterality: N/A;   HERNIA REPAIR  11/30/1981   HERNIA REPAIR     INGUINAL HERNIA REPAIR Right 06/25/2015   Procedure: HERNIA REPAIR INGUINAL ADULT;  Surgeon: Nadeen Landau, MD;  Location: [facility] ORS;  Service: General;  Laterality: Right;   plate in rt forearm Right    Rod placed Rt leg Right    SKIN CANCER EXCISION  02/28/2014   BCCA-shoulder   Family History  Problem Relation Age of Onset   Diabetes Mother    Diabetes Brother    Diabetes Brother    Prostate cancer Neg Hx    Colon cancer Neg Hx    Social History   Socioeconomic History   Marital status: Married    Spouse name: Not on file   Number of children: 2   Years of education: Not on file   Highest education level: Not on file  Occupational History   Not on file  Tobacco Use   Smoking status: Never   Smokeless tobacco: Never  Substance and Sexual Activity   Alcohol use: Yes    Alcohol/week: 0.0 standard drinks of alcohol    Comment: rare   Drug use: No   Sexual activity: Yes  Other Topics Concern   Not on file   Social History Narrative   Not on file   Social Determinants of Health   Financial Resource Strain: Low Risk  (05/08/2022)   Overall Financial Resource Strain (CARDIA)    Difficulty of Paying Living Expenses: Not hard at all  Food Insecurity: No Food Insecurity (05/08/2022)   Hunger Vital Sign    Worried About Running Out of Food in the Last Year: Never true    Ran Out of Food in the Last Year: Never true  Transportation Needs: No Transportation Needs (05/08/2022)   PRAPARE - Administrator, Civil Service (Medical): No    Lack of Transportation (Non-Medical): No  Physical Activity: Sufficiently Active (05/08/2022)   Exercise Vital Sign    Days of Exercise per Week: 3 days    Minutes of Exercise per Session: 60 min  Stress: No Stress Concern Present (05/08/2022)   [employer] of Occupational Health - Occupational Stress Questionnaire    Feeling of Stress : Not at all  Social Connections: Unknown (05/08/2022)   Social Connection and Isolation Panel [NHANES]    Frequency of Communication with Friends and Family: More than three times a week  Frequency of Social Gatherings with Friends and Family: More than three times a week    Attends Religious Services: Not on file    Active Member of Clubs or Organizations: Not on file    Attends Banker Meetings: Not on file    Marital Status: Married     Review of Systems  Constitutional:  Negative for appetite change and unexpected weight change.  HENT:  Negative for congestion and sinus pressure.   Respiratory:  Negative for cough, chest tightness and shortness of breath.   Cardiovascular:  Negative for chest pain and palpitations.  Gastrointestinal:  Negative for abdominal pain, diarrhea, nausea and vomiting.  Genitourinary:  Negative for difficulty urinating and dysuria.  Musculoskeletal:  Negative for joint swelling and myalgias.  Skin:  Negative for color change and rash.  Neurological:  Negative for dizziness  and headaches.  Psychiatric/Behavioral:  Negative for agitation and dysphoric mood.        Objective:     BP 110/70   Pulse 87   Temp 98.1 F (36.7 C)   Resp 16   Ht 5\' 10"  (1.778 m)   Wt 163 lb (73.9 kg)   SpO2 98%   BMI 23.39 kg/m  Wt Readings from Last 3 Encounters:  03/25/23 163 lb (73.9 kg)  11/02/22 168 lb 12.8 oz (76.6 kg)  08/05/22 163 lb 9.6 oz (74.2 kg)    Physical Exam Vitals reviewed.  Constitutional:      General: He is not in acute distress.    Appearance: Normal appearance. He is well-developed.  HENT:     Head: Normocephalic and atraumatic.     Right Ear: External ear normal.     Left Ear: External ear normal.  Eyes:     General: No scleral icterus.       Right eye: No discharge.        Left eye: No discharge.     Conjunctiva/sclera: Conjunctivae normal.  Cardiovascular:     Rate and Rhythm: Normal rate and regular rhythm.  Pulmonary:     Effort: Pulmonary effort is normal. No respiratory distress.     Breath sounds: Normal breath sounds.  Abdominal:     General: Bowel sounds are normal.     Palpations: Abdomen is soft.     Tenderness: There is no abdominal tenderness.     Comments: Fullness - c/w left inguinal hernia.  No pain.  Easily reduced.   Musculoskeletal:        General: No swelling or tenderness.     Cervical back: Neck supple. No tenderness.  Lymphadenopathy:     Cervical: No cervical adenopathy.  Skin:    Findings: No erythema or rash.  Neurological:     Mental Status: He is alert.  Psychiatric:        Mood and Affect: Mood normal.        Behavior: Behavior normal.      Outpatient Encounter Medications as of 03/25/2023  Medication Sig   fluticasone (FLONASE) 50 MCG/ACT nasal spray PLACE 2 SPRAYS INTO BOTH NOSTRILS DAILY AS NEEDED   Magnesium 400 MG TABS Take by mouth.   rosuvastatin (CRESTOR) 5 MG tablet Take 1 tablet (5 mg total) by mouth daily. D/c 10   sodium chloride (OCEAN) 0.65 % SOLN nasal spray Place 2 sprays into  both nostrils daily.   [DISCONTINUED] fluticasone (FLONASE) 50 MCG/ACT nasal spray PLACE 2 SPRAYS INTO BOTH NOSTRILS DAILY AS NEEDED   [DISCONTINUED] rosuvastatin (CRESTOR) 5 MG tablet  Take 1 tablet (5 mg total) by mouth daily. D/c 10   No facility-administered encounter medications on file as of 03/25/2023.     Lab Results  Component Value Date   WBC 9.5 07/27/2022   HGB 15.3 07/27/2022   HCT 45 07/27/2022   PLT 229 07/27/2022   GLUCOSE 97 11/02/2022   CHOL 146 11/02/2022   TRIG 145.0 11/02/2022   HDL 46.00 11/02/2022   LDLCALC 71 11/02/2022   ALT 15 11/02/2022   AST 18 11/02/2022   NA 140 11/02/2022   K 4.0 11/02/2022   CL 103 11/02/2022   CREATININE 1.13 11/02/2022   BUN 16 11/02/2022   CO2 29 11/02/2022   TSH 2.96 09/23/2021   PSA 1.30 11/02/2022    MR Brain W Wo Contrast  Result Date: 08/17/2022 CLINICAL DATA:  Provided history: Lightheadedness. Dizziness, nonspecific. Additional history provided by scanning technologist: Patient reports intermittent dizziness throughout the day, symptoms since June. EXAM: MRI HEAD WITHOUT AND WITH CONTRAST TECHNIQUE: Multiplanar, multiecho pulse sequences of the brain and surrounding structures were obtained without and with intravenous contrast. CONTRAST:  7.64mL GADAVIST GADOBUTROL 1 MMOL/ML IV SOLN COMPARISON:  No pertinent prior exams available for comparison. FINDINGS: Brain: Mild generalized cerebral atrophy. Multifocal T2 FLAIR hyperintense signal abnormality within the cerebral white matter, nonspecific but compatible with mild chronic small vessel ischemic disease. There is no acute infarct. No evidence of an intracranial mass. No chronic intracranial blood products. No extra-axial fluid collection. No midline shift. No pathologic intracranial enhancement identified. Vascular: Maintained flow voids within the proximal large arterial vessels. Skull and upper cervical spine: No focal suspicious marrow lesion. Sinuses/Orbits: No mass or  acute finding within the imaged orbits. Prior bilateral ocular lens replacement. No significant paranasal sinus disease. IMPRESSION: No evidence of acute intracranial abnormality. Mild chronic small vessel ischemic changes within the cerebral white matter. Mild generalized cerebral atrophy. Electronically Signed   By: Jackey Loge D.O.   On: 08/17/2022 17:39       Assessment & Plan:  Hypercholesterolemia Assessment & Plan: Low cholesterol diet and exercise.  On crestor.  Follow lipid panel and liver function tests.    Orders: -     Basic metabolic panel; Future -     Hepatic function panel; Future -     Lipid panel; Future  Left inguinal hernia -     Ambulatory referral to General Surgery  Acute frontal sinusitis, recurrence not specified -     Fluticasone Propionate; PLACE 2 SPRAYS INTO BOTH NOSTRILS DAILY AS NEEDED  Dispense: 48 mL; Refill: 1  Hyperlipidemia, unspecified hyperlipidemia type -     Rosuvastatin Calcium; Take 1 tablet (5 mg total) by mouth daily. D/c 10  Dispense: 90 tablet; Refill: 3  Non-recurrent unilateral inguinal hernia without obstruction or gangrene Assessment & Plan: Exam c/w left inguinal hernia.  No increased pain with palpation.  Given change - discussed surgery referral.  Order placed for referral.       [place], MD

## 2023-03-25 NOTE — Assessment & Plan Note (Signed)
Exam c/w left inguinal hernia.  No increased pain with palpation.  Given change - discussed surgery referral.  Order placed for referral.

## 2023-03-25 NOTE — Assessment & Plan Note (Signed)
Low cholesterol diet and exercise.  On crestor.  Follow lipid panel and liver function tests.  

## 2023-04-06 ENCOUNTER — Ambulatory Visit: Payer: Self-pay | Admitting: General Surgery

## 2023-04-06 NOTE — H&P (Signed)
PATIENT PROFILE: Troy Arnold is a 77 y.o. male who presents to the Clinic for consultation at the request of Dr. Scott for evaluation of inguinal hernia.  PCP:  Scott, Charlene S, MD  HISTORY OF PRESENT ILLNESS: [name] reports he has been feeling a bulge in the left groin for few weeks.  He endorses that the bulge is causing discomfort and difficulty straining.  No significant pain radiation.  No alleviating factors.  Aggravating factor is straining.  Denies any episode of abdominal distention, nausea or vomiting.  Patient endorses having history of right inguinal hernia repair x 2.  He had any history of incarcerated right inguinal hernia.  It was pretty painful.  Last surgery was about 6 7 years ago.  Both were done with open approach.   PROBLEM LIST: Problem List  Date Reviewed: 02/05/2023          Noted   Basal cell carcinoma of skin 03/03/2023   Overview    Jul 26, 2022 Entered By: UMEH,NENOI MARY Comment: history of BCC      Other specified counseling 03/03/2023   Counseling, unspecified 03/03/2023   Hyperlipidemia, unspecified 03/03/2023   Hyperlipidemia 03/03/2023   Dizziness and giddiness 03/03/2023   Dizziness 03/03/2023   Ventricular premature complex 03/03/2023   Light headedness 08/10/2022   Overview    Last Assessment & Plan: Formatting of this note might be different from the original. Recently treated for sinus infection as outlined.  ENT - prescribed prednisone.  Persistent feeling - head doesn't feel right.  Not orthostatic on exam.  No chest pain or sob.  EKG - SR with no acute changes noted.  Discussed need for f/u with ophthalmology to confirm vision/eyes not contributing.  Given persistence, obtain MRI Brain to further evaluate.      Foot pain 08/05/2022   Overview    Formatting of this note might be different from the original. Jul 27, 2022 Entered By: UMEH,NENOI MARY Comment: bilateral      Seasonal allergic rhinitis 08/05/2022   Sinusitis, unspecified 08/05/2022    Overview    Last Assessment & Plan: Formatting of this note might be different from the original. Recently treated for sinus infection with zpak and levaquin.  Also saw ENT - prescribed prednisone.  Has f/u with ENT. Pursue further w/up as outlined for light headed sensation.      URI (upper respiratory infection) 09/20/2021   Overview    Last Assessment & Plan: Formatting of this note might be different from the original. Symptoms as outlined.  Persistent.  Recent covid in 06/2021.  Concern regarding possible bacterial infection (sinus/uri).  Continue mucinex.  Saline nasal spray and nasacort nasal spray as directed.  Delsym if needed for cough.  ominicef as directed.  Instructed on taking probiotics as directed.  Follow.  Notify me if symptoms persists.      Influenza 01/05/2018   Overview    Last Assessment & Plan: Formatting of this note might be different from the original. Anticipate he did have very attenuated case of flu with minimal symptoms - due to receiving influenza vaccine this season. Reassuringly feeling well last 2 days. Benign exam. Reviewed symptoms to update us or seek further evaluation. Pt agrees with plan.      History of colonic polyps 01/07/2016   Overview    Formatting of this note might be different from the original. 01/07/16 - colonoscopy - one 4 mm polyp in the distal sigmoid, one 5mm polyp in the mid   sigmoid colon, diverticulosis and non bleeding internal hemorrhoids.  Pathology - tubular adenomatous polyp - mid sigmoid colon and hyperplastic polyp distal sigmoid colon.   Last Assessment & Plan: Formatting of this note might be different from the original. Colonoscopy 01/2016 as outlined in overview.  Recheck 5 years.      Colon cancer screening 10/16/2015   Chest pressure 10/23/2014   Overview    Last Assessment & Plan: Formatting of this note might be different from the original. The chest pain is overall atypical and nonexertional. It is possible this might be related  to PVCs. I requested a stress echocardiogram to rule out a structural or ischemic etiology.      PVC (premature ventricular contraction) 10/23/2014   Overview    Last Assessment & Plan: Formatting of this note might be different from the original. Had holter recently that revealed rare PVC's.  Currently stays active with no symptoms.  Follow.      Basal cell carcinoma 04/08/2014   Overview    Last Assessment & Plan: Formatting of this note might be different from the original. Followed by Dr Dasher.      History of anemia 02/21/2013   Overview    Last Assessment & Plan: Formatting of this note might be different from the original. Follow cbc.      Hypercholesterolemia 02/21/2013   Overview    Last Assessment & Plan: Formatting of this note might be different from the original. Low cholesterol diet and exercise.  On crestor.  Follow lipid panel and liver function tests.       GENERAL REVIEW OF SYSTEMS:   General ROS: negative for - chills, fatigue, fever, weight gain or weight loss Allergy and Immunology ROS: negative for - hives  Hematological and Lymphatic ROS: negative for - bleeding problems or bruising, negative for palpable nodes Endocrine ROS: negative for - heat or cold intolerance, hair changes Respiratory ROS: negative for - cough, shortness of breath or wheezing Cardiovascular ROS: no chest pain or palpitations GI ROS: negative for nausea, vomiting, abdominal pain, diarrhea, constipation Musculoskeletal ROS: negative for - joint swelling or muscle pain Neurological ROS: negative for - confusion, syncope Dermatological ROS: negative for pruritus and rash Psychiatric: negative for anxiety, depression, difficulty sleeping and memory loss  MEDICATIONS: Current Outpatient Medications  Medication Sig Dispense Refill   magnesium 200 mg Take by mouth 2 (two) times daily     rosuvastatin (CRESTOR) 5 MG tablet Take 5 mg by mouth once daily     cyanocobalamin (VITAMIN B12)  1000 MCG tablet Take 1,000 mcg by mouth once daily (Patient not taking: Reported on 04/06/2023)     ergocalciferol, vitamin D2, 1,250 mcg (50,000 unit) capsule Take 1 capsule (50,000 Units total) by mouth once a week for 8 doses 8 capsule 0   No current facility-administered medications for this visit.    ALLERGIES: Patient has no known allergies.  PAST MEDICAL HISTORY: Past Medical History:  Diagnosis Date   Anemia    Hyperlipidemia    Hyperplastic colon polyp 01/06/2016   Tubular adenoma of colon 01/06/2016    PAST SURGICAL HISTORY: Past Surgical History:  Procedure Laterality Date   COMPLEX REPAIR LEG Right 1964   metal rod placed in leg   COMPLEX REPAIR ARM Right 1971   plate placed - broken forearm   HERNIA REPAIR  1981   COLONOSCOPY  01/06/2016   Tubular adenoma/Hyperplastic colon polyp/Repeat 5yrs/MUS   CATARACT EXTRACTION       FAMILY   HISTORY: Family History  Problem Relation Name Age of Onset   Diabetes Mother     Diabetes Brother     Coronary Artery Disease (Blocked arteries around heart) Maternal Grandmother     Diabetes Maternal Grandfather       SOCIAL HISTORY: Social History   Socioeconomic History   Marital status: Married  Tobacco Use   Smoking status: Never   Smokeless tobacco: Never  Vaping Use   Vaping status: Never Used  Substance and Sexual Activity   Alcohol use: No   Drug use: No   Sexual activity: Yes    Partners: Female   Social Determinants of Health   Financial Resource Strain: Low Risk  (05/08/2022)   Received from [place], [place]   Overall Financial Resource Strain (CARDIA)    Difficulty of Paying Living Expenses: Not hard at all  Food Insecurity: No Food Insecurity (05/08/2022)   Received from [place], [place]   Hunger Vital Sign    Worried About Running Out of Food in the Last Year: Never true    Ran Out of Food in the Last Year: Never true  Transportation Needs: No Transportation Needs (05/08/2022)   Received  from [place], [place]   PRAPARE - Transportation    Lack of Transportation (Medical): No    Lack of Transportation (Non-Medical): No    PHYSICAL EXAM: Vitals:   04/06/23 1459  BP: 123/76  Pulse: 77   Body mass index is 23.53 kg/m. Weight: 74.4 kg (164 lb)   GENERAL: Alert, active, oriented x3  HEENT: Pupils equal reactive to light. Extraocular movements are intact. Sclera clear. Palpebral conjunctiva normal red color.Pharynx clear.  NECK: Supple with no palpable mass and no adenopathy.  LUNGS: Sound clear with no rales rhonchi or wheezes.  HEART: Regular rhythm S1 and S2 without murmur.  ABDOMEN: Soft and depressible, nontender with no palpable mass, no hepatomegaly.  Left inguinal hernia, reducible.  EXTREMITIES: Well-developed well-nourished symmetrical with no dependent edema.  NEUROLOGICAL: Awake alert oriented, facial expression symmetrical, moving all extremities.  REVIEW OF DATA: I have reviewed the following data today: No visits with results within 3 Month(s) from this visit.  Latest known visit with results is:  Initial consult on 10/27/2022  Component Date Value   Thyroid Stimulating Horm* 10/27/2022 3.285    Vitamin B12 10/27/2022 245 (L)    Vitamin D, 25-Hydroxy - * 10/27/2022 20.5 (L)    C Reactive Protein - Lab* 10/27/2022 2    Sedimentation Rate-Autom* 10/27/2022 17         ASSESSMENT: Mr. Silvestri is a 77 y.o. male presenting for consultation for left inguinal hernia.    The patient presents with a symptomatic, reducible left inguinal hernia. Patient was oriented about the diagnosis of inguinal hernia and its implication. The patient was oriented about the treatment alternatives (observation vs surgical repair). Due to patient symptoms, repair is recommended. Patient oriented about the surgical procedure, the use of mesh and its risk of complications such as: infection, bleeding, injury to vas deference, vasculature and testicle, injury to  bowel or bladder, and chronic pain.  Patient with history of bilateral inguinal hernia.  Right inguinal hernia as seen on previous CT scan in 2016 was fixed.  On that CT scan on 2015 I personally cannot see left inguinal hernia as well.  Will proceed with fixing the left inguinal hernia with the minimally invasive approach.  Non-recurrent unilateral inguinal hernia without obstruction or gangrene [K40.90]  PLAN:   1.  Robotic assisted laparoscopic left inguinal hernia repair with mesh ([id number]) 2.  Avoid taking aspirin 5 days before procedure 3.  Contact us if has any question or concern.   Patient and/or representative verbalized understanding, all questions were answered, and were agreeable with the plan outlined above.    Sophiea Ueda Cintron-Diaz, MD  Electronically signed by Merriam Brandner Cintron-Diaz, MD 

## 2023-04-06 NOTE — H&P (View-Only) (Signed)
PATIENT PROFILE: Troy Arnold is a 77 y.o. male who presents to the Clinic for consultation at the request of Dr. Lorin Picket for evaluation of inguinal hernia.  PCP:  Charm Barges, MD  HISTORY OF PRESENT ILLNESS: [name] reports he has been feeling a bulge in the left groin for few weeks.  He endorses that the bulge is causing discomfort and difficulty straining.  No significant pain radiation.  No alleviating factors.  Aggravating factor is straining.  Denies any episode of abdominal distention, nausea or vomiting.  Patient endorses having history of right inguinal hernia repair x 2.  He had any history of incarcerated right inguinal hernia.  It was pretty painful.  Last surgery was about 6 7 years ago.  Both were done with open approach.   PROBLEM LIST: Problem List  Date Reviewed: 02/05/2023          Noted   Basal cell carcinoma of skin 03/03/2023   Overview    Jul 26, 2022 Entered By: Wilber Bihari Comment: history of BCC      Other specified counseling 03/03/2023   Counseling, unspecified 03/03/2023   Hyperlipidemia, unspecified 03/03/2023   Hyperlipidemia 03/03/2023   Dizziness and giddiness 03/03/2023   Dizziness 03/03/2023   Ventricular premature complex 03/03/2023   Light headedness 08/10/2022   Overview    Last Assessment & Plan: Formatting of this note might be different from the original. Recently treated for sinus infection as outlined.  ENT - prescribed prednisone.  Persistent feeling - head doesn't feel right.  Not orthostatic on exam.  No chest pain or sob.  EKG - SR with no acute changes noted.  Discussed need for f/u with ophthalmology to confirm vision/eyes not contributing.  Given persistence, obtain MRI Brain to further evaluate.      Foot pain 08/05/2022   Overview    Formatting of this note might be different from the original. Jul 27, 2022 Entered By: Wilber Bihari Comment: bilateral      Seasonal allergic rhinitis 08/05/2022   Sinusitis, unspecified 08/05/2022    Overview    Last Assessment & Plan: Formatting of this note might be different from the original. Recently treated for sinus infection with zpak and levaquin.  Also saw ENT - prescribed prednisone.  Has f/u with ENT. Pursue further w/up as outlined for light headed sensation.      URI (upper respiratory infection) 09/20/2021   Overview    Last Assessment & Plan: Formatting of this note might be different from the original. Symptoms as outlined.  Persistent.  Recent covid in 06/2021.  Concern regarding possible bacterial infection (sinus/uri).  Continue mucinex.  Saline nasal spray and nasacort nasal spray as directed.  Delsym if needed for cough.  ominicef as directed.  Instructed on taking probiotics as directed.  Follow.  Notify me if symptoms persists.      Influenza 01/05/2018   Overview    Last Assessment & Plan: Formatting of this note might be different from the original. Anticipate he did have very attenuated case of flu with minimal symptoms - due to receiving influenza vaccine this season. Reassuringly feeling well last 2 days. Benign exam. Reviewed symptoms to update [place] or seek further evaluation. Pt agrees with plan.      History of colonic polyps 01/07/2016   Overview    Formatting of this note might be different from the original. 01/07/16 - colonoscopy - one 4 mm polyp in the distal sigmoid, one 5mm polyp in the mid  sigmoid colon, diverticulosis and non bleeding internal hemorrhoids.  Pathology - tubular adenomatous polyp - mid sigmoid colon and hyperplastic polyp distal sigmoid colon.   Last Assessment & Plan: Formatting of this note might be different from the original. Colonoscopy 01/2016 as outlined in overview.  Recheck 5 years.      Colon cancer screening 10/16/2015   Chest pressure 10/23/2014   Overview    Last Assessment & Plan: Formatting of this note might be different from the original. The chest pain is overall atypical and nonexertional. It is possible this might be related  to PVCs. I requested a stress echocardiogram to rule out a structural or ischemic etiology.      PVC (premature ventricular contraction) 10/23/2014   Overview    Last Assessment & Plan: Formatting of this note might be different from the original. Had holter recently that revealed rare PVC's.  Currently stays active with no symptoms.  Follow.      Basal cell carcinoma 04/08/2014   Overview    Last Assessment & Plan: Formatting of this note might be different from the original. Followed by Dr Adolphus Birchwood.      History of anemia 02/21/2013   Overview    Last Assessment & Plan: Formatting of this note might be different from the original. Follow cbc.      Hypercholesterolemia 02/21/2013   Overview    Last Assessment & Plan: Formatting of this note might be different from the original. Low cholesterol diet and exercise.  On crestor.  Follow lipid panel and liver function tests.       GENERAL REVIEW OF SYSTEMS:   General ROS: negative for - chills, fatigue, fever, weight gain or weight loss Allergy and Immunology ROS: negative for - hives  Hematological and Lymphatic ROS: negative for - bleeding problems or bruising, negative for palpable nodes Endocrine ROS: negative for - heat or cold intolerance, hair changes Respiratory ROS: negative for - cough, shortness of breath or wheezing Cardiovascular ROS: no chest pain or palpitations GI ROS: negative for nausea, vomiting, abdominal pain, diarrhea, constipation Musculoskeletal ROS: negative for - joint swelling or muscle pain Neurological ROS: negative for - confusion, syncope Dermatological ROS: negative for pruritus and rash Psychiatric: negative for anxiety, depression, difficulty sleeping and memory loss  MEDICATIONS: Current Outpatient Medications  Medication Sig Dispense Refill   magnesium 200 mg Take by mouth 2 (two) times daily     rosuvastatin (CRESTOR) 5 MG tablet Take 5 mg by mouth once daily     cyanocobalamin (VITAMIN B12)  1000 MCG tablet Take 1,000 mcg by mouth once daily (Patient not taking: Reported on 04/06/2023)     ergocalciferol, vitamin D2, 1,250 mcg (50,000 unit) capsule Take 1 capsule (50,000 Units total) by mouth once a week for 8 doses 8 capsule 0   No current facility-administered medications for this visit.    ALLERGIES: Patient has no known allergies.  PAST MEDICAL HISTORY: Past Medical History:  Diagnosis Date   Anemia    Hyperlipidemia    Hyperplastic colon polyp 01/06/2016   Tubular adenoma of colon 01/06/2016    PAST SURGICAL HISTORY: Past Surgical History:  Procedure Laterality Date   COMPLEX REPAIR LEG Right 1964   metal rod placed in leg   COMPLEX REPAIR ARM Right 1971   plate placed - broken forearm   HERNIA REPAIR  1981   COLONOSCOPY  01/06/2016   Tubular adenoma/Hyperplastic colon polyp/Repeat 25yrs/MUS   CATARACT EXTRACTION       FAMILY  HISTORY: Family History  Problem Relation Name Age of Onset   Diabetes Mother     Diabetes Brother     Coronary Artery Disease (Blocked arteries around heart) Maternal Grandmother     Diabetes Maternal Grandfather       SOCIAL HISTORY: Social History   Socioeconomic History   Marital status: Married  Tobacco Use   Smoking status: Never   Smokeless tobacco: Never  Vaping Use   Vaping status: Never Used  Substance and Sexual Activity   Alcohol use: No   Drug use: No   Sexual activity: Yes    Partners: Female   Social Determinants of Health   Financial Resource Strain: Low Risk  (05/08/2022)   Received from [facility], [place]   Overall Financial Resource Strain (CARDIA)    Difficulty of Paying Living Expenses: Not hard at all  Food Insecurity: No Food Insecurity (05/08/2022)   Received from [facility], [place]   Hunger Vital Sign    Worried About Running Out of Food in the Last Year: Never true    Ran Out of Food in the Last Year: Never true  Transportation Needs: No Transportation Needs (05/08/2022)   Received  from [facility], [place]   PRAPARE - Transportation    Lack of Transportation (Medical): No    Lack of Transportation (Non-Medical): No    PHYSICAL EXAM: Vitals:   04/06/23 1459  BP: 123/76  Pulse: 77   Body mass index is 23.53 kg/m. Weight: 74.4 kg (164 lb)   GENERAL: Alert, active, oriented x3  HEENT: Pupils equal reactive to light. Extraocular movements are intact. Sclera clear. Palpebral conjunctiva normal red color.Pharynx clear.  NECK: Supple with no palpable mass and no adenopathy.  LUNGS: Sound clear with no rales rhonchi or wheezes.  HEART: Regular rhythm S1 and S2 without murmur.  ABDOMEN: Soft and depressible, nontender with no palpable mass, no hepatomegaly.  Left inguinal hernia, reducible.  EXTREMITIES: Well-developed well-nourished symmetrical with no dependent edema.  NEUROLOGICAL: Awake alert oriented, facial expression symmetrical, moving all extremities.  REVIEW OF DATA: I have reviewed the following data today: No visits with results within 3 Month(s) from this visit.  Latest known visit with results is:  Initial consult on 10/27/2022  Component Date Value   Thyroid Stimulating Horm* 10/27/2022 3.285    Vitamin B12 10/27/2022 245 (L)    Vitamin D, 25-Hydroxy - * 10/27/2022 20.5 (L)    C Reactive Protein - Lab* 10/27/2022 2    Sedimentation Rate-Autom* 10/27/2022 17         ASSESSMENT: Mr. Mahnken is a 77 y.o. male presenting for consultation for left inguinal hernia.    The patient presents with a symptomatic, reducible left inguinal hernia. Patient was oriented about the diagnosis of inguinal hernia and its implication. The patient was oriented about the treatment alternatives (observation vs surgical repair). Due to patient symptoms, repair is recommended. Patient oriented about the surgical procedure, the use of mesh and its risk of complications such as: infection, bleeding, injury to vas deference, vasculature and testicle, injury to  bowel or bladder, and chronic pain.  Patient with history of bilateral inguinal hernia.  Right inguinal hernia as seen on previous CT scan in 2016 was fixed.  On that CT scan on 2015 I personally cannot see left inguinal hernia as well.  Will proceed with fixing the left inguinal hernia with the minimally invasive approach.  Non-recurrent unilateral inguinal hernia without obstruction or gangrene [K40.90]  PLAN:  1.  Robotic assisted laparoscopic left inguinal hernia repair with mesh (54098) 2.  Avoid taking aspirin 5 days before procedure 3.  Contact us if has any question or concern.   Patient and/or representative verbalized understanding, all questions were answered, and were agreeable with the plan outlined above.    Carolan Shiver, MD  Electronically signed by Carolan Shiver, MD

## 2023-04-08 ENCOUNTER — Other Ambulatory Visit: Payer: Self-pay

## 2023-04-08 ENCOUNTER — Encounter
Admission: RE | Admit: 2023-04-08 | Discharge: 2023-04-08 | Disposition: A | Discharge status: Home / Self Care | Payer: Medicare Other | Source: Ambulatory Visit | Attending: General Surgery | Admitting: General Surgery

## 2023-04-08 VITALS — Ht 70.0 in | Wt 160.0 lb

## 2023-04-08 DIAGNOSIS — E7800 Pure hypercholesterolemia, unspecified: Secondary | ICD-10-CM

## 2023-04-08 DIAGNOSIS — R0789 Other chest pain: Secondary | ICD-10-CM

## 2023-04-08 DIAGNOSIS — R42 Dizziness and giddiness: Secondary | ICD-10-CM

## 2023-04-08 DIAGNOSIS — Z862 Personal history of diseases of the blood and blood-forming organs and certain disorders involving the immune mechanism: Secondary | ICD-10-CM

## 2023-04-08 DIAGNOSIS — I493 Ventricular premature depolarization: Secondary | ICD-10-CM

## 2023-04-08 NOTE — Patient Instructions (Addendum)
Your procedure is scheduled on: Monday 04/12/23 To find out your arrival time, please call [phone] between 1PM - 3PM on:   Friday 04/09/23 Report to the Registration Desk on the 1st floor of the [facility]. Valet parking is available.  If your arrival time is 6:00 am, do not arrive before that time as the [facility] entrance doors do not open until 6:00 am.  REMEMBER: Instructions that are not followed completely may result in serious medical risk, up to and including death; or upon the discretion of your surgeon and anesthesiologist your surgery may need to be rescheduled.  Do not eat food or drink any liquids after midnight the night before surgery.  No gum chewing or hard candies.  One week prior to surgery: Stop Anti-inflammatories (NSAIDS) such as Advil, Aleve, Ibuprofen, Motrin, Naproxen, Naprosyn and Aspirin based products such as Excedrin, Goody's Powder, BC Powder. You may however, continue to take Tylenol if needed for pain up until the day of surgery.  Stop ANY OVER THE COUNTER supplements until after surgery.  Continue taking all prescribed medications.   TAKE ONLY THESE MEDICATIONS THE MORNING OF SURGERY WITH A SIP OF WATER:  rosuvastatin (CRESTOR) 5 MG tablet   No Alcohol for 24 hours before or after surgery.  No Smoking including e-cigarettes for 24 hours before surgery.  No chewable tobacco products for at least 6 hours before surgery.  No nicotine patches on the day of surgery.  Do not use any "recreational" drugs for at least a week (preferably 2 weeks) before your surgery.  Please be advised that the combination of cocaine and anesthesia may have negative outcomes, up to and including death. If you test positive for cocaine, your surgery will be cancelled.  On the morning of surgery brush your teeth with toothpaste and water, you may rinse your mouth with mouthwash if you wish. Do not swallow any toothpaste or mouthwash.  Use CHG Soap or wipes as  directed on instruction sheet.  Do not wear lotions, powders, or perfumes.   Do not shave body hair from the neck down 48 hours before surgery.  Wear comfortable clothing (specific to your surgery type) to the hospital.  Do not wear jewelry, make-up, hairpins, clips or nail polish.  Contact lenses, hearing aids and dentures may not be worn into surgery.  Do not bring valuables to the hospital. [facility] is not responsible for any missing/lost belongings or valuables.   Notify your doctor if there is any change in your medical condition (cold, fever, infection).  If you are being discharged the day of surgery, you will not be allowed to drive home. You will need a responsible individual to drive you home and stay with you for 24 hours after surgery.   If you are taking public transportation, you will need to have a responsible individual with you.  If you are being admitted to the hospital overnight, leave your suitcase in the car. After surgery it may be brought to your room.  In case of increased patient census, it may be necessary for you, the patient, to continue your postoperative care in the Same Day Surgery department.  After surgery, you can help prevent lung complications by doing breathing exercises.  Take deep breaths and cough every 1-2 hours. Your doctor may order a device called an Incentive Spirometer to help you take deep breaths. When coughing or sneezing, hold a pillow firmly against your incision with both hands. This is called "splinting." Doing this  helps protect your incision. It also decreases belly discomfort.  Surgery Visitation Policy:  Patients undergoing a surgery or procedure may have two family members or support persons with them as long as the person is not COVID-19 positive or experiencing its symptoms.   Inpatient Visitation:    Visiting hours are 7 a.m. to 8 p.m. Up to four visitors are allowed at one time in a patient room. The visitors may  rotate out with other people during the day. One designated support person (adult) may remain overnight.  Please call the [facility] at [phone] if you have any questions about these instructions.     Preparing for Surgery with CHLORHEXIDINE GLUCONATE (CHG) Soap  Chlorhexidine Gluconate (CHG) Soap  o An antiseptic cleaner that kills germs and bonds with the skin to continue killing germs even after washing  o Used for showering the night before surgery and morning of surgery  Before surgery, you can play an important role by reducing the number of germs on your skin.  CHG (Chlorhexidine gluconate) soap is an antiseptic cleanser which kills germs and bonds with the skin to continue killing germs even after washing.  Please do not use if you have an allergy to CHG or antibacterial soaps. If your skin becomes reddened/irritated stop using the CHG.  1. Shower the NIGHT BEFORE SURGERY and the MORNING OF SURGERY with CHG soap.  2. If you choose to wash your hair, wash your hair first as usual with your normal shampoo.  3. After shampooing, rinse your hair and body thoroughly to remove the shampoo.  4. Use CHG as you would any other liquid soap. You can apply CHG directly to the skin and wash gently with a scrungie or a clean washcloth.  5. Apply the CHG soap to your body only from the neck down. Do not use on open wounds or open sores. Avoid contact with your eyes, ears, mouth, and genitals (private parts). Wash face and genitals (private parts) with your normal soap.  6. Wash thoroughly, paying special attention to the area where your surgery will be performed.  7. Thoroughly rinse your body with warm water.  8. Do not shower/wash with your normal soap after using and rinsing off the CHG soap.  9. Pat yourself dry with a clean towel.  10. Wear clean pajamas to bed the night before surgery.  12. Place clean sheets on your bed the night of your first shower  and do not sleep with pets.  13. Shower again with the CHG soap on the day of surgery prior to arriving at the hospital.  14. Do not apply any deodorants/lotions/powders.  15. Please wear clean clothes to the hospital.

## 2023-04-09 ENCOUNTER — Encounter
Admission: RE | Admit: 2023-04-09 | Discharge: 2023-04-09 | Disposition: A | Discharge status: Home / Self Care | Payer: Medicare Other | Source: Ambulatory Visit | Attending: General Surgery | Admitting: General Surgery

## 2023-04-09 ENCOUNTER — Encounter: Payer: Self-pay | Admitting: Urgent Care

## 2023-04-09 DIAGNOSIS — E7800 Pure hypercholesterolemia, unspecified: Secondary | ICD-10-CM | POA: Diagnosis not present

## 2023-04-09 DIAGNOSIS — Z862 Personal history of diseases of the blood and blood-forming organs and certain disorders involving the immune mechanism: Secondary | ICD-10-CM | POA: Insufficient documentation

## 2023-04-09 DIAGNOSIS — Z01810 Encounter for preprocedural cardiovascular examination: Secondary | ICD-10-CM

## 2023-04-09 DIAGNOSIS — Z01818 Encounter for other preprocedural examination: Secondary | ICD-10-CM | POA: Insufficient documentation

## 2023-04-09 DIAGNOSIS — I493 Ventricular premature depolarization: Secondary | ICD-10-CM | POA: Diagnosis not present

## 2023-04-09 DIAGNOSIS — R42 Dizziness and giddiness: Secondary | ICD-10-CM | POA: Insufficient documentation

## 2023-04-09 DIAGNOSIS — R0789 Other chest pain: Secondary | ICD-10-CM | POA: Diagnosis not present

## 2023-04-09 LAB — CBC
HCT: 39.3 % (ref 39.0–52.0)
Hemoglobin: 13.2 g/dL (ref 13.0–17.0)
MCH: 31.7 pg (ref 26.0–34.0)
MCHC: 33.6 g/dL (ref 30.0–36.0)
MCV: 94.2 fL (ref 80.0–100.0)
Platelets: 206 10*3/uL (ref 150–400)
RBC: 4.17 MIL/uL — ABNORMAL LOW (ref 4.22–5.81)
RDW: 13.1 % (ref 11.5–15.5)
WBC: 4.7 10*3/uL (ref 4.0–10.5)
nRBC: 0 % (ref 0.0–0.2)

## 2023-04-11 MED ORDER — LACTATED RINGERS IV SOLN: INTRAVENOUS | Status: DC

## 2023-04-11 MED ORDER — CHLORHEXIDINE GLUCONATE 0.12 % MT SOLN
15.0000 mL | Freq: Once | OROMUCOSAL | Status: AC
  Administered 2023-04-12: 15 mL via OROMUCOSAL

## 2023-04-11 MED ORDER — CEFAZOLIN SODIUM-DEXTROSE 2-4 GM/100ML-% IV SOLN
2.0000 g | INTRAVENOUS | Status: AC
  Administered 2023-04-12: 2 g via INTRAVENOUS

## 2023-04-11 MED ORDER — FAMOTIDINE 20 MG PO TABS
20.0000 mg | ORAL_TABLET | Freq: Once | ORAL | Status: AC
  Administered 2023-04-12: 20 mg via ORAL

## 2023-04-11 MED ORDER — ORAL CARE MOUTH RINSE: 15.0000 mL | Freq: Once | OROMUCOSAL | Status: AC

## 2023-04-12 ENCOUNTER — Ambulatory Visit: Payer: Medicare Other | Admitting: Urgent Care

## 2023-04-12 ENCOUNTER — Ambulatory Visit
Admission: RE | Admit: 2023-04-12 | Discharge: 2023-04-12 | Disposition: A | Discharge status: Home / Self Care | Payer: Medicare Other | Attending: General Surgery | Admitting: General Surgery

## 2023-04-12 ENCOUNTER — Encounter: Payer: Self-pay | Admitting: General Surgery

## 2023-04-12 ENCOUNTER — Encounter
Admission: RE | Disposition: A | Discharge status: Home / Self Care | Payer: Self-pay | Source: Home / Self Care | Attending: General Surgery

## 2023-04-12 ENCOUNTER — Other Ambulatory Visit: Payer: Self-pay

## 2023-04-12 ENCOUNTER — Ambulatory Visit: Payer: Medicare Other | Admitting: Certified Registered"

## 2023-04-12 DIAGNOSIS — K4090 Unilateral inguinal hernia, without obstruction or gangrene, not specified as recurrent: Secondary | ICD-10-CM | POA: Insufficient documentation

## 2023-04-12 HISTORY — PX: XI ROBOTIC ASSISTED INGUINAL HERNIA REPAIR WITH MESH: SHX6706

## 2023-04-12 SURGERY — REPAIR, HERNIA, INGUINAL, ROBOT-ASSISTED, LAPAROSCOPIC, USING MESH
Anesthesia: General | Site: Inguinal | Laterality: Left

## 2023-04-12 MED ORDER — ONDANSETRON HCL 4 MG/2ML IJ SOLN
INTRAMUSCULAR | Status: DC | PRN
  Administered 2023-04-12: 4 mg via INTRAVENOUS

## 2023-04-12 MED ORDER — FAMOTIDINE 20 MG PO TABS
ORAL_TABLET | ORAL | Status: AC
  Filled 2023-04-12: qty 1

## 2023-04-12 MED ORDER — FENTANYL CITRATE (PF) 100 MCG/2ML IJ SOLN
INTRAMUSCULAR | Status: AC
  Filled 2023-04-12: qty 2

## 2023-04-12 MED ORDER — OXYCODONE HCL 5 MG PO TABS
ORAL_TABLET | ORAL | Status: AC
  Filled 2023-04-12: qty 1

## 2023-04-12 MED ORDER — BUPIVACAINE-EPINEPHRINE 0.25% -1:200000 IJ SOLN
INTRAMUSCULAR | Status: DC | PRN
  Administered 2023-04-12: 30 mL

## 2023-04-12 MED ORDER — EPINEPHRINE PF 1 MG/ML IJ SOLN
INTRAMUSCULAR | Status: AC
  Filled 2023-04-12: qty 1

## 2023-04-12 MED ORDER — DEXAMETHASONE SODIUM PHOSPHATE 10 MG/ML IJ SOLN
INTRAMUSCULAR | Status: DC | PRN
  Administered 2023-04-12: 10 mg via INTRAVENOUS

## 2023-04-12 MED ORDER — ACETAMINOPHEN 10 MG/ML IV SOLN
INTRAVENOUS | Status: DC | PRN
  Administered 2023-04-12: 1000 mg via INTRAVENOUS

## 2023-04-12 MED ORDER — ROCURONIUM BROMIDE 100 MG/10ML IV SOLN
INTRAVENOUS | Status: DC | PRN
  Administered 2023-04-12: 50 mg via INTRAVENOUS

## 2023-04-12 MED ORDER — DROPERIDOL 2.5 MG/ML IJ SOLN: 0.6250 mg | Freq: Once | INTRAMUSCULAR | Status: DC | PRN

## 2023-04-12 MED ORDER — HYDROCODONE-ACETAMINOPHEN 5-325 MG PO TABS: 1.0000 | ORAL_TABLET | ORAL | 0 refills | Status: AC | PRN

## 2023-04-12 MED ORDER — DEXMEDETOMIDINE HCL IN NACL 80 MCG/20ML IV SOLN
INTRAVENOUS | Status: DC | PRN
  Administered 2023-04-12 (×2): 8 ug via INTRAVENOUS

## 2023-04-12 MED ORDER — FENTANYL CITRATE (PF) 100 MCG/2ML IJ SOLN
INTRAMUSCULAR | Status: DC | PRN
  Administered 2023-04-12 (×2): 50 ug via INTRAVENOUS

## 2023-04-12 MED ORDER — BUPIVACAINE HCL (PF) 0.25 % IJ SOLN
INTRAMUSCULAR | Status: AC
  Filled 2023-04-12: qty 30

## 2023-04-12 MED ORDER — PROPOFOL 10 MG/ML IV BOLUS
INTRAVENOUS | Status: DC | PRN
  Administered 2023-04-12: 120 mg via INTRAVENOUS

## 2023-04-12 MED ORDER — PROPOFOL 10 MG/ML IV BOLUS
INTRAVENOUS | Status: AC
  Filled 2023-04-12: qty 20

## 2023-04-12 MED ORDER — SUGAMMADEX SODIUM 200 MG/2ML IV SOLN
INTRAVENOUS | Status: DC | PRN
  Administered 2023-04-12: 200 mg via INTRAVENOUS

## 2023-04-12 MED ORDER — CEFAZOLIN SODIUM-DEXTROSE 2-4 GM/100ML-% IV SOLN
INTRAVENOUS | Status: AC
  Filled 2023-04-12: qty 100

## 2023-04-12 MED ORDER — MIDAZOLAM HCL 2 MG/2ML IJ SOLN
INTRAMUSCULAR | Status: AC
  Filled 2023-04-12: qty 2

## 2023-04-12 MED ORDER — MIDAZOLAM HCL 2 MG/2ML IJ SOLN
INTRAMUSCULAR | Status: DC | PRN
  Administered 2023-04-12: 1 mg via INTRAVENOUS

## 2023-04-12 MED ORDER — CHLORHEXIDINE GLUCONATE 0.12 % MT SOLN
OROMUCOSAL | Status: AC
  Filled 2023-04-12: qty 15

## 2023-04-12 MED ORDER — FENTANYL CITRATE (PF) 100 MCG/2ML IJ SOLN
25.0000 ug | INTRAMUSCULAR | Status: DC | PRN
  Administered 2023-04-12: 25 ug via INTRAVENOUS

## 2023-04-12 MED ORDER — LIDOCAINE HCL (CARDIAC) PF 100 MG/5ML IV SOSY
PREFILLED_SYRINGE | INTRAVENOUS | Status: DC | PRN
  Administered 2023-04-12: 60 mg via INTRAVENOUS

## 2023-04-12 MED ORDER — OXYCODONE HCL 5 MG PO TABS
5.0000 mg | ORAL_TABLET | ORAL | Status: DC | PRN
  Administered 2023-04-12: 5 mg via ORAL

## 2023-04-12 SURGICAL SUPPLY — 48 items
ADH SKN CLS APL DERMABOND .7 (GAUZE/BANDAGES/DRESSINGS) ×1
BAG PRESSURE INF REUSE 1000 (BAG) IMPLANT
BLADE SURG SZ11 CARB STEEL (BLADE) ×2 IMPLANT
COVER TIP SHEARS 8 DVNC (MISCELLANEOUS) ×2 IMPLANT
COVER WAND RF STERILE (DRAPES) ×2 IMPLANT
DERMABOND ADVANCED .7 DNX12 (GAUZE/BANDAGES/DRESSINGS) ×2 IMPLANT
DRAPE ARM DVNC X/XI (DISPOSABLE) ×6 IMPLANT
DRAPE COLUMN DVNC XI (DISPOSABLE) ×2 IMPLANT
ELECT REM PT RETURN 9FT ADLT (ELECTROSURGICAL) ×1
ELECTRODE REM PT RTRN 9FT ADLT (ELECTROSURGICAL) ×2 IMPLANT
FORCEPS BPLR R/ABLATION 8 DVNC (INSTRUMENTS) ×2 IMPLANT
GLOVE BIO SURGEON STRL SZ 6.5 (GLOVE) ×4 IMPLANT
GLOVE BIOGEL PI IND STRL 6.5 (GLOVE) ×4 IMPLANT
GOWN STRL REUS W/ TWL LRG LVL3 (GOWN DISPOSABLE) ×6 IMPLANT
GOWN STRL REUS W/TWL LRG LVL3 (GOWN DISPOSABLE) ×4
IRRIGATOR SUCT 8 DISP DVNC XI (IRRIGATION / IRRIGATOR) IMPLANT
IV CATH ANGIO 12GX3 LT BLUE (NEEDLE) IMPLANT
IV NS 1000ML (IV SOLUTION)
IV NS 1000ML BAXH (IV SOLUTION) IMPLANT
KIT PINK PAD W/HEAD ARE REST (MISCELLANEOUS) ×1
KIT PINK PAD W/HEAD ARM REST (MISCELLANEOUS) ×2 IMPLANT
LABEL OR SOLS (LABEL) IMPLANT
MANIFOLD NEPTUNE II (INSTRUMENTS) ×2 IMPLANT
MESH 3DMAX MID 5X7 LT XLRG (Mesh General) IMPLANT
NDL DRIVE SUT CUT DVNC (INSTRUMENTS) ×2 IMPLANT
NDL HYPO 22X1.5 SAFETY MO (MISCELLANEOUS) ×2 IMPLANT
NDL INSUFFLATION 14GA 120MM (NEEDLE) ×2 IMPLANT
NEEDLE DRIVE SUT CUT DVNC (INSTRUMENTS) ×1 IMPLANT
NEEDLE HYPO 22X1.5 SAFETY MO (MISCELLANEOUS) ×1 IMPLANT
NEEDLE INSUFFLATION 14GA 120MM (NEEDLE) ×1 IMPLANT
OBTURATOR OPTICAL STND 8 DVNC (TROCAR) ×1
OBTURATOR OPTICALSTD 8 DVNC (TROCAR) ×2 IMPLANT
PACK LAP CHOLECYSTECTOMY (MISCELLANEOUS) ×2 IMPLANT
SCISSORS MNPLR CVD DVNC XI (INSTRUMENTS) ×2 IMPLANT
SEAL UNIV 5-12 XI (MISCELLANEOUS) ×6 IMPLANT
SET TUBE SMOKE EVAC HIGH FLOW (TUBING) ×2 IMPLANT
SOL ELECTROSURG ANTI STICK (MISCELLANEOUS) ×1
SOLUTION ELECTROSURG ANTI STCK (MISCELLANEOUS) ×2 IMPLANT
SUT MNCRL 4-0 (SUTURE) ×1
SUT MNCRL 4-0 27XMFL (SUTURE) ×1
SUT VIC AB 2-0 SH 27 (SUTURE) ×1
SUT VIC AB 2-0 SH 27XBRD (SUTURE) ×2 IMPLANT
SUT VLOC 90 S/L VL9 GS22 (SUTURE) ×2 IMPLANT
SUTURE MNCRL 4-0 27XMF (SUTURE) ×2 IMPLANT
TAPE TRANSPORE STRL 2 31045 (GAUZE/BANDAGES/DRESSINGS) IMPLANT
TRAP FLUID SMOKE EVACUATOR (MISCELLANEOUS) ×2 IMPLANT
TRAY FOLEY MTR SLVR 16FR STAT (SET/KITS/TRAYS/PACK) ×2 IMPLANT
WATER STERILE IRR 500ML POUR (IV SOLUTION) ×2 IMPLANT

## 2023-04-12 NOTE — Anesthesia Preprocedure Evaluation (Signed)
Anesthesia Evaluation  Patient identified by MRN, date of birth, ID band Patient awake    Reviewed: Allergy & Precautions, H&P , NPO status , Patient's Chart, lab work & pertinent test results  History of Anesthesia Complications Negative for: history of anesthetic complications  Airway Mallampati: III  TM Distance: <3 FB Neck ROM: limited    Dental  (+) Poor Dentition, Chipped, Dental Advidsory Given   Pulmonary neg pulmonary ROS, neg shortness of breath   Pulmonary exam normal breath sounds clear to auscultation       Cardiovascular Exercise Tolerance: Good (-) angina (-) Past MI and (-) DOE negative cardio ROS Normal cardiovascular exam Rhythm:regular Rate:Normal     Neuro/Psych negative neurological ROS  negative psych ROS   GI/Hepatic negative GI ROS, Neg liver ROS,neg GERD  ,,  Endo/Other  negative endocrine ROS    Renal/GU negative Renal ROS  negative genitourinary   Musculoskeletal   Abdominal   Peds  Hematology negative hematology ROS (+)   Anesthesia Other Findings Past Medical History:   Anemia                                                       Hypercholesterolemia                                         Cancer (HCC)                                                   Comment:skin  Past Surgical History:   HERNIA REPAIR                                    1983         SKIN CANCER EXCISION                             4/15           Comment:BCCA-shoulder   HERNIA REPAIR                                                 Rod placed Rt leg                               Right              plate in rt forearm                             Right              INGUINAL HERNIA REPAIR                          Right 06/25/2015  Comment:Procedure: HERNIA REPAIR INGUINAL ADULT;                Surgeon: Nadeen Landau, MD;  Location:               [facility] ORS;  Service: General;  Laterality:                Right;  BMI    Body Mass Index   22.52 kg/m 2      Reproductive/Obstetrics negative OB ROS                             Anesthesia Physical Anesthesia Plan  ASA: 2  Anesthesia Plan: General   Post-op Pain Management:    Induction: Intravenous  PONV Risk Score and Plan: 2 and Ondansetron, Dexamethasone and Treatment may vary due to age or medical condition  Airway Management Planned: Oral ETT  Additional Equipment:   Intra-op Plan:   Post-operative Plan: Extubation in OR  Informed Consent: I have reviewed the patients History and Physical, chart, labs and discussed the procedure including the risks, benefits and alternatives for the proposed anesthesia with the patient or authorized representative who has indicated his/her understanding and acceptance.     Dental Advisory Given  Plan Discussed with: Anesthesiologist, CRNA and Surgeon  Anesthesia Plan Comments:         Anesthesia Quick Evaluation

## 2023-04-12 NOTE — Anesthesia Procedure Notes (Signed)
Procedure Name: Intubation Date/Time: 04/12/2023 7:41 AM  Performed by: Cheral Bay, CRNAPre-anesthesia Checklist: Patient identified, Emergency Drugs available, Suction available and Patient being monitored Patient Re-evaluated:Patient Re-evaluated prior to induction Oxygen Delivery Method: Circle system utilized Preoxygenation: Pre-oxygenation with 100% oxygen Induction Type: IV induction Ventilation: Mask ventilation without difficulty Laryngoscope Size: McGraph and 4 Grade View: Grade II Tube type: Oral Tube size: 7.0 mm Number of attempts: 1 Airway Equipment and Method: Stylet and Oral airway Placement Confirmation: ETT inserted through vocal cords under direct vision, positive ETCO2 and breath sounds checked- equal and bilateral Secured at: 21 cm Tube secured with: Tape Dental Injury: Teeth and Oropharynx as per pre-operative assessment

## 2023-04-12 NOTE — Discharge Instructions (Addendum)
  Diet: Resume home heart healthy regular diet.   Activity: No heavy lifting >20 pounds (children, pets, laundry, garbage) or strenuous activity until follow-up, but light activity and walking are encouraged. Do not drive or drink alcohol if taking narcotic pain medications.  Wound care: May shower with soapy water and pat dry (do not rub incisions), but no baths or submerging incision underwater until follow-up. (no swimming)   Medications: Resume all home medications. For mild to moderate pain: acetaminophen (Tylenol) ***or ibuprofen (if no kidney disease). Combining Tylenol with alcohol can substantially increase your risk of causing liver disease. Narcotic pain medications, if prescribed, can be used for severe pain, though may cause nausea, constipation, and drowsiness. Do not combine Tylenol and Norco within a 6 hour period as Norco contains Tylenol. If you do not need the narcotic pain medication, you do not need to fill the prescription.  Call office ([phone]) at any time if any questions, worsening pain, fevers/chills, bleeding, drainage from incision site, or other concerns.   AMBULATORY SURGERY  DISCHARGE INSTRUCTIONS   The drugs that you were given will stay in your system until tomorrow so for the next 24 hours you should not:  Drive an automobile Make any legal decisions Drink any alcoholic beverage   You may resume regular meals tomorrow.  Today it is better to start with liquids and gradually work up to solid foods.  You may eat anything you prefer, but it is better to start with liquids, then soup and crackers, and gradually work up to solid foods.   Please notify your doctor immediately if you have any unusual bleeding, trouble breathing, redness and pain at the surgery site, drainage, fever, or pain not relieved by medication.    Additional Instructions:        Please contact your physician with any problems or Same Day Surgery at [phone], Monday  through Friday 6 am to 4 pm, or [place] [facility] [place] Main number at [phone].  

## 2023-04-12 NOTE — Transfer of Care (Signed)
Immediate Anesthesia Transfer of Care Note  Patient: Troy Arnold  Procedure(s) Performed: XI ROBOTIC ASSISTED INGUINAL HERNIA REPAIR WITH MESH (Left: Inguinal)  Patient Location: PACU  Anesthesia Type:General  Level of Consciousness: sedated  Airway & Oxygen Therapy: Patient Spontanous Breathing  Post-op Assessment: Report given to RN and Post -op Vital signs reviewed and stable  Post vital signs: Reviewed and stable  Last Vitals:  Vitals Value Taken Time  BP 117/62 04/12/23 0900  Temp    Pulse 73 04/12/23 0903  Resp 14 04/12/23 0903  SpO2 100 % 04/12/23 0903  Vitals shown include unvalidated device data.  Last Pain:  Vitals:   04/12/23 0613  TempSrc: Temporal  PainSc: 0-No pain         Complications: No notable events documented.

## 2023-04-12 NOTE — Interval H&P Note (Signed)
History and Physical Interval Note:  04/12/2023 6:48 AM  Troy Arnold  has presented today for surgery, with the diagnosis of K40.90 non recurrent unilateral inguinal hernia w/o obstruction or gangrene.  The various methods of treatment have been discussed with the patient and family. After consideration of risks, benefits and other options for treatment, the patient has consented to  Procedure(s): XI ROBOTIC ASSISTED INGUINAL HERNIA REPAIR WITH MESH (Left) as a surgical intervention.  The patient's history has been reviewed, patient examined, no change in status, stable for surgery.  I have reviewed the patient's chart and labs.  Questions were answered to the patient's satisfaction.     Carolan Shiver

## 2023-04-12 NOTE — Op Note (Signed)
Preoperative diagnosis: Left inguinal hernia.   Postoperative diagnosis: Left inguinal hernia.  Procedure: Robotic assisted Laparoscopic Transabdominal preperitoneal laparoscopic (TAPP) repair of left inguinal hernia.  Anesthesia: GETA  Surgeon: Dr. Hazle Quant  Wound Classification: Clean  Indications:  Patient is a 77 y.o. male developed a symptomatic left inguinal hernia. Repair was indicated.  Findings: 1. Left direct Inguinal hernia identified 2. Vas deferens and cord structures identified and preserved 3. Bard Extra Large 3D Max MID Anatomical mesh used for repair 4. Adequate hemostasis.   Description of procedure:  The patient was taken to the operating room and the correct side of surgery was verified. The patient was placed supine with right arm tucked at the side. After obtaining adequate anesthesia, the patient's abdomen was prepped and draped in standard sterile fashion. A time-out was completed verifying correct patient, procedure, site, positioning, and implant(s) and/or special equipment prior to beginning this procedure.  An incision was made in a natural skin line above the umbilicus. The fascia was elevated and the Veress needle inserted. Proper position was confirmed by aspiration and saline meniscus test.  The abdomen was insufflated with carbon dioxide to a pressure of 15 mmHg. The patient tolerated insufflation well.  Abdominal cavity was entered using Optiview technique with a millimeter trocar.  No injury was identified.  Another 2 mm trocars were placed lateral to each rectus muscle.  Scissors and bipolar forceps were inserted under direct visualization. At the robotic console: Transverse peritoneal incision is made about 8 cm superior to the inguinal defect. Medial to the epigastric vessels, the parietal compartment is dissected to visualize the rectus muscle. This is carried down to the symphysis pubis and the retropubic space is dissected to expose at least 2 cm  contralateral to the midline. Cooper's ligament is exposed and cleared at least 2 cm below the ligament to ensure adequate space for the inferior border of the mesh. Hesselbach's triangle is cleared assessing for a direct hernia. The hernia is reduced dissecting the contents away from the border of the transversalis (white) fascia. Lateral to the epigastric vessels, the dissection is carried out in visceral compartment continuing in the true preperitoneal plane. Cord structures with medial retraction and a combination of blunt/sharp dissection and focused cautery. This dissection was continued until the cord structures are "parietalized" completely, allowing for visualization of the reflected peritoneum that is continuous with the line originating 2 cm below Coopers medially and across the psoas muscle in the lateral compartment.  The internal ring was interrogated for a cord lipoma.  Having achieved a complete dissection with a critical view of the entire myopectineal orifice, an XL mesh was then positioned centered at the iliopubic tract with the medial side crossing the midline and the inferior edge positioned 2 cm below Coopers ligament. The lateral aspect of the mesh extended 3-5 cm beyond the lateral edge of the psoas. The mesh is fixated using an interrupted suture placed to the ipsilateral Coopers ligament. A second suture was done at the medial superior aspect of the mesh fixating this to the rectus complex.  The peritoneal flap is closed with running barbed suture. Additional holes in the peritoneum closed with suture. Preperitoneal space gas aspirated to visualize the peritoneum apposed directly against the mesh and ensure no folding, lifting, or buckling of the mesh. Skin is closed, sterile dressings are applied.  The patient tolerated the procedure well and was taken to the postanesthesia care unit in stable condition  Specimen: None  Complications: None  Estimated Blood Loss: 5 mL

## 2023-04-13 ENCOUNTER — Encounter: Payer: Self-pay | Admitting: General Surgery

## 2023-04-13 NOTE — Anesthesia Postprocedure Evaluation (Signed)
Anesthesia Post Note  Patient: Jaquaris Mascarenas Hadsall  Procedure(s) Performed: XI ROBOTIC ASSISTED INGUINAL HERNIA REPAIR WITH MESH (Left: Inguinal)  Patient location during evaluation: PACU Anesthesia Type: General Level of consciousness: awake and alert Pain management: pain level controlled Vital Signs Assessment: post-procedure vital signs reviewed and stable Respiratory status: spontaneous breathing, nonlabored ventilation, respiratory function stable and patient connected to nasal cannula oxygen Cardiovascular status: blood pressure returned to baseline and stable Postop Assessment: no apparent nausea or vomiting Anesthetic complications: no   No notable events documented.   Last Vitals:  Vitals:   04/12/23 1009 04/12/23 1013  BP:  113/71  Pulse: 71 72  Resp:  15  Temp:  (!) 36.1 C  SpO2: 97% 97%    Last Pain:  Vitals:   04/13/23 0824  TempSrc:   PainSc: 0-No pain                 Lenard Simmer

## 2023-04-29 ENCOUNTER — Other Ambulatory Visit (INDEPENDENT_AMBULATORY_CARE_PROVIDER_SITE_OTHER): Payer: Medicare Other

## 2023-04-29 DIAGNOSIS — E7800 Pure hypercholesterolemia, unspecified: Secondary | ICD-10-CM

## 2023-04-29 LAB — HEPATIC FUNCTION PANEL
ALT: 15 U/L (ref 0–53)
AST: 18 U/L (ref 0–37)
Albumin: 4.4 g/dL (ref 3.5–5.2)
Alkaline Phosphatase: 41 U/L (ref 39–117)
Bilirubin, Direct: 0.1 mg/dL (ref 0.0–0.3)
Total Bilirubin: 0.7 mg/dL (ref 0.2–1.2)
Total Protein: 6.9 g/dL (ref 6.0–8.3)

## 2023-04-29 LAB — LIPID PANEL
Cholesterol: 137 mg/dL (ref 0–200)
HDL: 43.2 mg/dL (ref >39.00)
LDL Cholesterol: 71 mg/dL (ref 0–99)
NonHDL: 93.57
Total CHOL/HDL Ratio: 3
Triglycerides: 115 mg/dL (ref 0.0–149.0)
VLDL: 23 mg/dL (ref 0.0–40.0)

## 2023-04-29 LAB — BASIC METABOLIC PANEL
BUN: 14 mg/dL (ref 6–23)
CO2: 28 mEq/L (ref 19–32)
Calcium: 9.2 mg/dL (ref 8.4–10.5)
Chloride: 105 mEq/L (ref 96–112)
Creatinine, Ser: 1.2 mg/dL (ref 0.40–1.50)
GFR: 57.99 mL/min — ABNORMAL LOW (ref >60.00)
Glucose, Bld: 94 mg/dL (ref 70–99)
Potassium: 3.9 mEq/L (ref 3.5–5.1)
Sodium: 143 mEq/L (ref 135–145)

## 2023-05-04 ENCOUNTER — Ambulatory Visit: Payer: Medicare HMO | Admitting: Internal Medicine

## 2023-05-05 ENCOUNTER — Encounter: Payer: Self-pay | Admitting: Internal Medicine

## 2023-05-05 ENCOUNTER — Ambulatory Visit (INDEPENDENT_AMBULATORY_CARE_PROVIDER_SITE_OTHER): Payer: Medicare Other | Admitting: Internal Medicine

## 2023-05-05 VITALS — BP 108/70 | HR 80 | Temp 97.9°F | Resp 16 | Ht 70.0 in | Wt 163.0 lb

## 2023-05-05 DIAGNOSIS — Z86010 Personal history of colonic polyps: Secondary | ICD-10-CM

## 2023-05-05 DIAGNOSIS — E7800 Pure hypercholesterolemia, unspecified: Secondary | ICD-10-CM

## 2023-05-05 DIAGNOSIS — K4090 Unilateral inguinal hernia, without obstruction or gangrene, not specified as recurrent: Secondary | ICD-10-CM

## 2023-05-05 DIAGNOSIS — R42 Dizziness and giddiness: Secondary | ICD-10-CM | POA: Diagnosis not present

## 2023-05-05 DIAGNOSIS — R944 Abnormal results of kidney function studies: Secondary | ICD-10-CM

## 2023-05-05 NOTE — Progress Notes (Signed)
Subjective:    [name], male    DOB: November 13, 1946, 77 y.o.   MRN: [id number]  Patient here for  Chief Complaint  Patient presents with   Medical Management of Chronic Issues    HPI Here to follow up regarding hypercholesterolemia.  S/p recent robotic assisted laparoscopic left inguinal hernia repair with mesh. Doing well s/p surgery.  Walking.  No chest pain or sob reported.  No abdominal pain.  No bowel change reported.  Has seen neurology.  Last see 02/05/23 - diagnosed with migraine with associated photophobia, phonophobia, nausea and head pressure.  Still with intermittent issues, but stable.  Planning colonoscopy 08/03/23.    Past Medical History:  Diagnosis Date   Anemia    Cancer (HCC)    skin   Hypercholesterolemia    Past Surgical History:  Procedure Laterality Date   cataract surgery     COLONOSCOPY WITH PROPOFOL N/A 01/06/2016   Procedure: COLONOSCOPY WITH PROPOFOL;  Surgeon: Christena Deem, MD;  Location: [facility] ENDOSCOPY;  Service: Endoscopy;  Laterality: N/A;   HERNIA REPAIR  11/30/1981   HERNIA REPAIR     INGUINAL HERNIA REPAIR Right 06/25/2015   Procedure: HERNIA REPAIR INGUINAL ADULT;  Surgeon: Nadeen Landau, MD;  Location: [facility] ORS;  Service: General;  Laterality: Right;   plate in rt forearm Right    Rod placed Rt leg Right    SKIN CANCER EXCISION  02/28/2014   BCCA-shoulder   XI ROBOTIC ASSISTED INGUINAL HERNIA REPAIR WITH MESH Left 04/12/2023   Procedure: XI ROBOTIC ASSISTED INGUINAL HERNIA REPAIR WITH MESH;  Surgeon: Carolan Shiver, MD;  Location: [facility] ORS;  Service: General;  Laterality: Left;   Family History  Problem Relation Age of Onset   Diabetes Mother    Diabetes Brother    Diabetes Brother    Prostate cancer Neg Hx    Colon cancer Neg Hx    Social History   Socioeconomic History   Marital status: Married    Spouse name: Not on file   Number of children: 2   Years of education: Not on file   Highest  education level: Not on file  Occupational History   Not on file  Tobacco Use   Smoking status: Never   Smokeless tobacco: Never  Vaping Use   Vaping Use: Never used  Substance and Sexual Activity   Alcohol use: Yes    Alcohol/week: 0.0 standard drinks of alcohol    Comment: rare   Drug use: No   Sexual activity: Yes  Other Topics Concern   Not on file  Social History Narrative   Not on file   Social Determinants of Health   Financial Resource Strain: Low Risk  (05/08/2022)   Overall Financial Resource Strain (CARDIA)    Difficulty of Paying Living Expenses: Not hard at all  Food Insecurity: No Food Insecurity (05/08/2022)   Hunger Vital Sign    Worried About Running Out of Food in the Last Year: Never true    Ran Out of Food in the Last Year: Never true  Transportation Needs: No Transportation Needs (05/08/2022)   PRAPARE - Administrator, Civil Service (Medical): No    Lack of Transportation (Non-Medical): No  Physical Activity: Sufficiently Active (05/08/2022)   Exercise Vital Sign    Days of Exercise per Week: 3 days    Minutes of Exercise per Session: 60 min  Stress: No Stress Concern Present (05/08/2022)   [employer] of  Occupational Health - Occupational Stress Questionnaire    Feeling of Stress : Not at all  Social Connections: Unknown (05/08/2022)   Social Connection and Isolation Panel [NHANES]    Frequency of Communication with Friends and Family: More than three times a week    Frequency of Social Gatherings with Friends and Family: More than three times a week    Attends Religious Services: Not on Marketing executive or Organizations: Not on file    Attends Banker Meetings: Not on file    Marital Status: Married     Review of Systems  Constitutional:  Negative for appetite change and unexpected weight change.  HENT:  Negative for congestion and sinus pressure.   Respiratory:  Negative for cough, chest tightness and  shortness of breath.   Cardiovascular:  Negative for chest pain, palpitations and leg swelling.  Gastrointestinal:  Negative for abdominal pain, diarrhea, nausea and vomiting.  Genitourinary:  Negative for difficulty urinating and dysuria.  Musculoskeletal:  Negative for joint swelling and myalgias.  Skin:  Negative for color change and rash.  Neurological:  Negative for dizziness and headaches.  Psychiatric/Behavioral:  Negative for agitation and dysphoric mood.        Objective:     BP 108/70   Pulse 80   Temp 97.9 F (36.6 C)   Resp 16   Ht 5\' 10"  (1.778 m)   Wt 163 lb (73.9 kg)   SpO2 98%   BMI 23.39 kg/m  Wt Readings from Last 3 Encounters:  05/05/23 163 lb (73.9 kg)  04/08/23 160 lb (72.6 kg)  03/25/23 163 lb (73.9 kg)    Physical Exam Constitutional:      General: He is not in acute distress.    Appearance: Normal appearance. He is well-developed.  HENT:     Head: Normocephalic and atraumatic.     Right Ear: External ear normal.     Left Ear: External ear normal.  Eyes:     General: No scleral icterus.       Right eye: No discharge.        Left eye: No discharge.  Cardiovascular:     Rate and Rhythm: Normal rate and regular rhythm.  Pulmonary:     Effort: Pulmonary effort is normal. No respiratory distress.     Breath sounds: Normal breath sounds.  Abdominal:     General: Bowel sounds are normal.     Palpations: Abdomen is soft.     Tenderness: There is no abdominal tenderness.  Musculoskeletal:        General: No swelling or tenderness.     Cervical back: Neck supple. No tenderness.  Lymphadenopathy:     Cervical: No cervical adenopathy.  Skin:    Findings: No erythema or rash.  Neurological:     Mental Status: He is alert.  Psychiatric:        Mood and Affect: Mood normal.        Behavior: Behavior normal.      Outpatient Encounter Medications as of 05/05/2023  Medication Sig   B Complex Vitamins (B COMPLEX 1 PO) Take 1 tablet by mouth  daily.   Cholecalciferol (VITAMIN D-3) 25 MCG (1000 UT) CAPS Take 1 capsule by mouth daily.   Magnesium 400 MG TABS Take 1 tablet by mouth daily.   rosuvastatin (CRESTOR) 5 MG tablet Take 1 tablet (5 mg total) by mouth daily. D/c 10   [DISCONTINUED] fluticasone (FLONASE) 50 MCG/ACT nasal  spray PLACE 2 SPRAYS INTO BOTH NOSTRILS DAILY AS NEEDED (Patient not taking: Reported on 04/12/2023)   [DISCONTINUED] sodium chloride (OCEAN) 0.65 % SOLN nasal spray Place 2 sprays into both nostrils as needed for congestion. (Patient not taking: Reported on 04/12/2023)   No facility-administered encounter medications on file as of 05/05/2023.     Lab Results  Component Value Date   WBC 4.7 04/09/2023   HGB 13.2 04/09/2023   HCT 39.3 04/09/2023   PLT 206 04/09/2023   GLUCOSE 94 04/29/2023   CHOL 137 04/29/2023   TRIG 115.0 04/29/2023   HDL 43.20 04/29/2023   LDLCALC 71 04/29/2023   ALT 15 04/29/2023   AST 18 04/29/2023   NA 143 04/29/2023   K 3.9 04/29/2023   CL 105 04/29/2023   CREATININE 1.20 04/29/2023   BUN 14 04/29/2023   CO2 28 04/29/2023   TSH 2.96 09/23/2021   PSA 1.30 11/02/2022    No results found.     Assessment & Plan:  Hypercholesterolemia Assessment & Plan: Low cholesterol diet and exercise.  On crestor.  Follow lipid panel and liver function tests.    Orders: -     Basic metabolic panel; Future  Non-recurrent unilateral inguinal hernia without obstruction or gangrene Assessment & Plan: 04/12/23 - left inguinal hernia repair.    Light headedness Assessment & Plan:  Has been having issues with light headedness/"feeling off".  MRI brain 08/17/22 - no acute abnormality.  Saw ENT and ophthalmology. Saw Dr Sherryll Burger 10/27/22.   Symptoms felt to be c/w migraine. Recommended magnesium and continuing crestor.  Started magnesium.     History of colonic polyps Assessment & Plan: Colonoscopy 01/2016 as outlined in overview.  Recheck 5 years.  Agreeable for referral. Colonoscopy  scheduled for 08/2023.    Decreased GFR Assessment & Plan: Noticed on recent labs.  Avoid antiinflammatories.  Stay hydrated.  Recheck met b.       Dale Leslie, MD

## 2023-05-09 ENCOUNTER — Encounter: Payer: Self-pay | Admitting: Internal Medicine

## 2023-05-09 DIAGNOSIS — R944 Abnormal results of kidney function studies: Secondary | ICD-10-CM | POA: Insufficient documentation

## 2023-05-09 NOTE — Assessment & Plan Note (Signed)
Low cholesterol diet and exercise.  On crestor.  Follow lipid panel and liver function tests.  

## 2023-05-09 NOTE — Assessment & Plan Note (Signed)
Has been having issues with light headedness/"feeling off".  MRI brain 08/17/22 - no acute abnormality.  Saw ENT and ophthalmology. Saw Dr Shah 10/27/22.   Symptoms felt to be c/w migraine. Recommended magnesium and continuing crestor.  Started magnesium.   

## 2023-05-09 NOTE — Assessment & Plan Note (Signed)
04/12/23 - left inguinal hernia repair.

## 2023-05-09 NOTE — Assessment & Plan Note (Signed)
Noticed on recent labs.  Avoid antiinflammatories.  Stay hydrated.  Recheck met b.

## 2023-05-09 NOTE — Assessment & Plan Note (Signed)
Colonoscopy 01/2016 as outlined in overview.  Recheck 5 years.  Agreeable for referral. Colonoscopy scheduled for 08/2023.

## 2023-06-07 ENCOUNTER — Other Ambulatory Visit (INDEPENDENT_AMBULATORY_CARE_PROVIDER_SITE_OTHER): Payer: Medicare Other

## 2023-06-07 DIAGNOSIS — E7800 Pure hypercholesterolemia, unspecified: Secondary | ICD-10-CM | POA: Diagnosis not present

## 2023-06-07 LAB — BASIC METABOLIC PANEL
BUN: 16 mg/dL (ref 6–23)
CO2: 29 mEq/L (ref 19–32)
Calcium: 9.3 mg/dL (ref 8.4–10.5)
Chloride: 102 mEq/L (ref 96–112)
Creatinine, Ser: 1.14 mg/dL (ref 0.40–1.50)
GFR: 61.62 mL/min (ref >60.00)
Glucose, Bld: 94 mg/dL (ref 70–99)
Potassium: 3.9 mEq/L (ref 3.5–5.1)
Sodium: 139 mEq/L (ref 135–145)

## 2023-06-08 ENCOUNTER — Ambulatory Visit: Admit: 2023-06-08 | Payer: Medicare Other

## 2023-06-08 SURGERY — COLONOSCOPY WITH PROPOFOL
Anesthesia: General

## 2023-07-26 ENCOUNTER — Encounter: Payer: Self-pay | Admitting: *Deleted

## 2023-08-03 ENCOUNTER — Encounter
Admission: RE | Disposition: A | Discharge status: Home / Self Care | Payer: Self-pay | Source: Home / Self Care | Attending: Gastroenterology

## 2023-08-03 ENCOUNTER — Ambulatory Visit
Admission: RE | Admit: 2023-08-03 | Discharge: 2023-08-03 | Disposition: A | Discharge status: Home / Self Care | Payer: Medicare Other | Attending: Gastroenterology | Admitting: Gastroenterology

## 2023-08-03 ENCOUNTER — Ambulatory Visit: Payer: Medicare Other | Admitting: Anesthesiology

## 2023-08-03 ENCOUNTER — Encounter: Payer: Self-pay | Admitting: *Deleted

## 2023-08-03 DIAGNOSIS — K5730 Diverticulosis of large intestine without perforation or abscess without bleeding: Secondary | ICD-10-CM | POA: Diagnosis not present

## 2023-08-03 DIAGNOSIS — Z1211 Encounter for screening for malignant neoplasm of colon: Secondary | ICD-10-CM | POA: Diagnosis present

## 2023-08-03 DIAGNOSIS — K640 First degree hemorrhoids: Secondary | ICD-10-CM | POA: Insufficient documentation

## 2023-08-03 DIAGNOSIS — Z86010 Personal history of colonic polyps: Secondary | ICD-10-CM | POA: Insufficient documentation

## 2023-08-03 HISTORY — PX: COLONOSCOPY WITH PROPOFOL: SHX5780

## 2023-08-03 SURGERY — COLONOSCOPY WITH PROPOFOL
Anesthesia: General

## 2023-08-03 MED ORDER — SODIUM CHLORIDE 0.9 % IV SOLN
INTRAVENOUS | Status: DC
  Administered 2023-08-03: 1000 mL via INTRAVENOUS

## 2023-08-03 MED ORDER — LIDOCAINE HCL (CARDIAC) PF 100 MG/5ML IV SOSY
PREFILLED_SYRINGE | INTRAVENOUS | Status: DC | PRN
  Administered 2023-08-03: 60 mg via INTRAVENOUS

## 2023-08-03 MED ORDER — PROPOFOL 500 MG/50ML IV EMUL
INTRAVENOUS | Status: DC | PRN
  Administered 2023-08-03: 150 ug/kg/min via INTRAVENOUS

## 2023-08-03 MED ORDER — PROPOFOL 10 MG/ML IV BOLUS
INTRAVENOUS | Status: DC | PRN
  Administered 2023-08-03: 70 mg via INTRAVENOUS

## 2023-08-03 NOTE — H&P (Signed)
Outpatient short stay form Pre-procedure 08/03/2023  Regis Bill, MD  Primary Physician: Dale Lakeland North, MD  Reason for visit:  Surveillance  History of present illness:    77 y/o gentleman with history of HLD and migraines here for surveillance colonoscopy. Last colonoscopy was about 7 years ago with small TA. No blood thinners. Recent hernia repair.    Current Facility-Administered Medications:    0.9 %  sodium chloride infusion, , Intravenous, Continuous, Nevaya Nagele, Rossie Muskrat, MD, Last Rate: 20 mL/hr at 08/03/23 0822, 1,000 mL at 08/03/23 1610  Medications Prior to Admission  Medication Sig Dispense Refill Last Dose   B Complex Vitamins (B COMPLEX 1 PO) Take 1 tablet by mouth daily.   Past Week   Cholecalciferol (VITAMIN D-3) 25 MCG (1000 UT) CAPS Take 1 capsule by mouth daily.   Past Week   Magnesium 400 MG TABS Take 1 tablet by mouth daily.   Past Week   rosuvastatin (CRESTOR) 5 MG tablet Take 1 tablet (5 mg total) by mouth daily. D/c 10 90 tablet 3 08/02/2023     No Known Allergies   Past Medical History:  Diagnosis Date   Anemia    Cancer (HCC)    skin   Headache 2024   Hypercholesterolemia     Review of systems:  Otherwise negative.    Physical Exam  Gen: Alert, oriented. Appears stated age.  HEENT: PERRLA. Lungs: No respiratory distress CV: RRR Abd: soft, benign, no masses Ext: No edema    Planned procedures: Proceed with colonoscopy. The patient understands the nature of the planned procedure, indications, risks, alternatives and potential complications including but not limited to bleeding, infection, perforation, damage to internal organs and possible oversedation/side effects from anesthesia. The patient agrees and gives consent to proceed.  Please refer to procedure notes for findings, recommendations and patient disposition/instructions.     Regis Bill, MD [facility] Gastroenterology

## 2023-08-03 NOTE — Transfer of Care (Signed)
Immediate Anesthesia Transfer of Care Note  Patient: Troy Arnold  Procedure(s) Performed: COLONOSCOPY WITH PROPOFOL  Patient Location: PACU  Anesthesia Type:General  Level of Consciousness: sedated  Airway & Oxygen Therapy: Patient Spontanous Breathing  Post-op Assessment: Report given to RN and Post -op Vital signs reviewed and stable  Post vital signs: Reviewed and stable  Last Vitals: See PACU flow sheet for normal temp Vitals Value Taken Time  BP 98/66 08/03/23 0913  Temp    Pulse 77 08/03/23 0914  Resp 16 08/03/23 0914  SpO2 98 % 08/03/23 0914  Vitals shown include unfiled device data.  Last Pain:  Vitals:   08/03/23 0818  TempSrc: Temporal  PainSc: 0-No pain         Complications: No notable events documented.

## 2023-08-03 NOTE — Anesthesia Postprocedure Evaluation (Signed)
Anesthesia Post Note  Patient: Troy Arnold  Procedure(s) Performed: COLONOSCOPY WITH PROPOFOL  Patient location during evaluation: Endoscopy Anesthesia Type: General Level of consciousness: awake and alert Pain management: pain level controlled Vital Signs Assessment: post-procedure vital signs reviewed and stable Respiratory status: spontaneous breathing, nonlabored ventilation, respiratory function stable and patient connected to nasal cannula oxygen Cardiovascular status: blood pressure returned to baseline and stable Postop Assessment: no apparent nausea or vomiting Anesthetic complications: no   No notable events documented.   Last Vitals:  Vitals:   08/03/23 0913 08/03/23 0923  BP: 98/66 105/68  Pulse:    Resp:    Temp: (!) 35.7 C   SpO2:      Last Pain:  Vitals:   08/03/23 0933  TempSrc:   PainSc: 0-No pain                 Louie Boston

## 2023-08-03 NOTE — Op Note (Signed)
[facility] Gastroenterology Patient Name: Troy Arnold Procedure Date: 08/03/2023 8:31 AM MRN: [id number] Account #: [id number] Date of Birth: 01/03/1946 Admit Type: Outpatient Age: 77 Room: [facility] ENDO ROOM 1 Gender: Male Note Status: Finalized Instrument Name: Prentice Docker [id number] Procedure:             Colonoscopy Indications:           Surveillance: Personal history of adenomatous polyps                         on last colonoscopy > 5 years ago Providers:             Eather Colas MD, MD Referring MD:          Dale Teaticket, MD (Referring MD) Medicines:             Monitored Anesthesia Care Complications:         No immediate complications. Procedure:             Pre-Anesthesia Assessment:                        - Prior to the procedure, a History and Physical was                         performed, and patient medications and allergies were                         reviewed. The patient is competent. The risks and                         benefits of the procedure and the sedation options and                         risks were discussed with the patient. All questions                         were answered and informed consent was obtained.                         Patient identification and proposed procedure were                         verified by the physician, the nurse, the                         anesthesiologist, the anesthetist and the technician                         in the endoscopy suite. Mental Status Examination:                         alert and oriented. Airway Examination: normal                         oropharyngeal airway and neck mobility. Respiratory                         Examination: clear to auscultation. CV Examination:  normal. Prophylactic Antibiotics: The patient does not                         require prophylactic antibiotics. Prior                         Anticoagulants: The patient has taken no  anticoagulant                         or antiplatelet agents. ASA Grade Assessment: II - A                         patient with mild systemic disease. After reviewing                         the risks and benefits, the patient was deemed in                         satisfactory condition to undergo the procedure. The                         anesthesia plan was to use monitored anesthesia care                         (MAC). Immediately prior to administration of                         medications, the patient was re-assessed for adequacy                         to receive sedatives. The heart rate, respiratory                         rate, oxygen saturations, blood pressure, adequacy of                         pulmonary ventilation, and response to care were                         monitored throughout the procedure. The physical                         status of the patient was re-assessed after the                         procedure.                        After obtaining informed consent, the colonoscope was                         passed under direct vision. Throughout the procedure,                         the patient's blood pressure, pulse, and oxygen                         saturations were monitored continuously. The  Colonoscope was introduced through the anus and                         advanced to the the terminal ileum. The colonoscopy                         was performed without difficulty. The patient                         tolerated the procedure well. The quality of the bowel                         preparation was good. The terminal ileum, ileocecal                         valve, appendiceal orifice, and rectum were                         photographed. Findings:      The perianal and digital rectal examinations were normal.      The terminal ileum appeared normal.      A few small-mouthed diverticula were found in the sigmoid colon.      Internal  hemorrhoids were found during retroflexion. The hemorrhoids       were Grade I (internal hemorrhoids that do not prolapse).      The exam was otherwise without abnormality on direct and retroflexion       views. Impression:            - The examined portion of the ileum was normal.                        - Diverticulosis in the sigmoid colon.                        - Internal hemorrhoids.                        - The examination was otherwise normal on direct and                         retroflexion views.                        - No specimens collected. Recommendation:        - Discharge patient to home.                        - Resume previous diet.                        - Continue present medications.                        - Repeat colonoscopy is not recommended due to current                         age (36 years or older) for surveillance.                        - Return to referring physician as previously  scheduled. Procedure Code(s):     --- Professional ---                        [phone], Colonoscopy, flexible; diagnostic, including                         collection of specimen(s) by brushing or washing, when                         performed (separate procedure) Diagnosis Code(s):     --- Professional ---                        Z86.010, Personal history of colonic polyps                        K64.0, First degree hemorrhoids                        K57.30, Diverticulosis of large intestine without                         perforation or abscess without bleeding CPT copyright 2022 American [employer] Association. All rights reserved. The codes documented in this report are preliminary and upon coder review may  be revised to meet current compliance requirements. Eather Colas MD, MD 08/03/2023 9:17:43 AM Number of Addenda: 0 Note Initiated On: 08/03/2023 8:31 AM Scope Withdrawal Time: 0 hours 7 minutes 43 seconds  Total Procedure Duration: 0 hours 15  minutes 56 seconds  Estimated Blood Loss:  Estimated blood loss: none.      [facility]

## 2023-08-03 NOTE — Anesthesia Preprocedure Evaluation (Signed)
Anesthesia Evaluation  Patient identified by MRN, date of birth, ID band Patient awake    Reviewed: Allergy & Precautions, NPO status , Patient's Chart, lab work & pertinent test results  Airway Mallampati: II  TM Distance: >3 FB Neck ROM: full    Dental no notable dental hx.    Pulmonary neg pulmonary ROS   Pulmonary exam normal        Cardiovascular negative cardio ROS Normal cardiovascular exam     Neuro/Psych  Headaches  negative psych ROS   GI/Hepatic negative GI ROS, Neg liver ROS,,,  Endo/Other  negative endocrine ROS    Renal/GU negative Renal ROS  negative genitourinary   Musculoskeletal   Abdominal   Peds  Hematology   Anesthesia Other Findings Past Medical History: No date: Anemia No date: Cancer (HCC)     Comment:  skin 2024: Headache No date: Hypercholesterolemia  Past Surgical History: No date: cataract surgery 01/06/2016: COLONOSCOPY WITH PROPOFOL; N/A     Comment:  Procedure: COLONOSCOPY WITH PROPOFOL;  Surgeon: Christena Deem, MD;  Location: [facility] ENDOSCOPY;  Service:               Endoscopy;  Laterality: N/A; 11/30/1981: HERNIA REPAIR No date: HERNIA REPAIR 06/25/2015: INGUINAL HERNIA REPAIR; Right     Comment:  Procedure: HERNIA REPAIR INGUINAL ADULT;  Surgeon:               Nadeen Landau, MD;  Location: [facility] ORS;  Service:               General;  Laterality: Right; No date: plate in rt forearm; Right No date: Rod placed Rt leg; Right 02/28/2014: SKIN CANCER EXCISION     Comment:  BCCA-shoulder 04/12/2023: XI ROBOTIC ASSISTED INGUINAL HERNIA REPAIR WITH MESH; Left     Comment:  Procedure: XI ROBOTIC ASSISTED INGUINAL HERNIA REPAIR               WITH MESH;  Surgeon: Carolan Shiver, MD;                Location: [facility] ORS;  Service: General;  Laterality: Left;     Reproductive/Obstetrics negative OB ROS                              Anesthesia Physical Anesthesia Plan  ASA: 2  Anesthesia Plan: General   Post-op Pain Management: Minimal or no pain anticipated   Induction: Intravenous  PONV Risk Score and Plan: 1 and Propofol infusion and TIVA  Airway Management Planned: Natural Airway and Nasal Cannula  Additional Equipment:   Intra-op Plan:   Post-operative Plan:   Informed Consent: I have reviewed the patients History and Physical, chart, labs and discussed the procedure including the risks, benefits and alternatives for the proposed anesthesia with the patient or authorized representative who has indicated his/her understanding and acceptance.     Dental Advisory Given  Plan Discussed with: Anesthesiologist, CRNA and Surgeon  Anesthesia Plan Comments: (Patient consented for risks of anesthesia including but not limited to:  - adverse reactions to medications - risk of airway placement if required - damage to eyes, teeth, lips or other oral mucosa - nerve damage due to positioning  - sore throat or hoarseness - Damage to heart, brain, nerves, lungs, other parts of body or loss of life  Patient voiced understanding.)  Anesthesia Quick Evaluation

## 2023-08-03 NOTE — Interval H&P Note (Signed)
History and Physical Interval Note:  08/03/2023 8:47 AM  Troy Arnold  has presented today for surgery, with the diagnosis of hx of adenomatous  polyp.  The various methods of treatment have been discussed with the patient and family. After consideration of risks, benefits and other options for treatment, the patient has consented to  Procedure(s): COLONOSCOPY WITH PROPOFOL (N/A) as a surgical intervention.  The patient's history has been reviewed, patient examined, no change in status, stable for surgery.  I have reviewed the patient's chart and labs.  Questions were answered to the patient's satisfaction.     Regis Bill  Ok to proceed with colonoscopy

## 2023-08-04 ENCOUNTER — Encounter: Payer: Self-pay | Admitting: Gastroenterology

## 2023-08-16 ENCOUNTER — Encounter: Payer: Self-pay | Admitting: Podiatry

## 2023-08-16 ENCOUNTER — Ambulatory Visit: Payer: Medicare Other | Admitting: Podiatry

## 2023-08-16 VITALS — BP 128/73 | HR 71

## 2023-08-16 DIAGNOSIS — M7741 Metatarsalgia, right foot: Secondary | ICD-10-CM

## 2023-08-16 DIAGNOSIS — M7742 Metatarsalgia, left foot: Secondary | ICD-10-CM

## 2023-08-16 DIAGNOSIS — L84 Corns and callosities: Secondary | ICD-10-CM

## 2023-08-16 NOTE — Progress Notes (Signed)
Subjective:  [name], male    DOB: 02/15/1946,  MRN: [id number]  Chief Complaint  Patient presents with   Callouses    "I got these on both sides.  He did them last year.  When you walk, it feels like needles in your feet."    77 y.o. male presents with the above complaint. History confirmed with patient.  The painful area is under the fifth toe on the outside of each foot.  Was doing much better and the pads were helpful last time  Objective:  Physical Exam: warm, good capillary refill, no trophic changes or ulcerative lesions, normal DP and PT pulses, normal sensory exam, and painful hyperkeratosis submetatarsal 5 bilateral.  Prominence of the metatarsal head here  Assessment:   1. Callus of foot   2. Metatarsalgia of both feet      Plan:  Patient was evaluated and treated and all questions answered.  We discussed etiology and treatment options of hyperkeratotic lesions in this particular area.    I debrided the lesion as a courtesy today and applied salinocaine he will leave it on for 24 hours.  He will return as needed if this does not improve.  Had relief with dancers pads and likely would improve with a custom molded foot orthosis that could be used in multiple shoes.  He will consider this and let me know if he would like to proceed this way and will be scheduled to be fitted for orthotics by our orthotist at his convenience when he is ready  Return if symptoms worsen or fail to improve.

## 2023-09-06 ENCOUNTER — Ambulatory Visit: Payer: Medicare Other | Admitting: Internal Medicine

## 2023-10-04 ENCOUNTER — Ambulatory Visit (INDEPENDENT_AMBULATORY_CARE_PROVIDER_SITE_OTHER): Payer: Medicare Other | Admitting: Internal Medicine

## 2023-10-04 ENCOUNTER — Encounter: Payer: Self-pay | Admitting: Internal Medicine

## 2023-10-04 VITALS — BP 120/70 | HR 79 | Temp 98.0°F | Resp 16 | Ht 70.0 in | Wt 163.4 lb

## 2023-10-04 DIAGNOSIS — Z0000 Encounter for general adult medical examination without abnormal findings: Secondary | ICD-10-CM | POA: Diagnosis not present

## 2023-10-04 DIAGNOSIS — E538 Deficiency of other specified B group vitamins: Secondary | ICD-10-CM | POA: Diagnosis not present

## 2023-10-04 DIAGNOSIS — Z23 Encounter for immunization: Secondary | ICD-10-CM

## 2023-10-04 DIAGNOSIS — R3911 Hesitancy of micturition: Secondary | ICD-10-CM

## 2023-10-04 DIAGNOSIS — Z125 Encounter for screening for malignant neoplasm of prostate: Secondary | ICD-10-CM

## 2023-10-04 DIAGNOSIS — Z860100 Personal history of colon polyps, unspecified: Secondary | ICD-10-CM

## 2023-10-04 DIAGNOSIS — R42 Dizziness and giddiness: Secondary | ICD-10-CM

## 2023-10-04 DIAGNOSIS — E7800 Pure hypercholesterolemia, unspecified: Secondary | ICD-10-CM

## 2023-10-04 NOTE — Progress Notes (Addendum)
Subjective:    [name], male    DOB: 1946-05-19, 77 y.o.   MRN: [id number]  Patient here for  Chief Complaint  Patient presents with   Medical Management of Chronic Issues    HPI Here for a scheduled follow up. Seeing neurology - diagnosed with migraine headache. Recommended continuing magnesium glycinate and riboflavin. He reports his head is doing better.  Stays active.  No chest pain or sob reported.  No abdominal pain or bowel change reported.  Some urinary symptoms - question of urinary hesitancy.  Discussed prostate. Requested psa to be checked.    Past Medical History:  Diagnosis Date   Anemia    Cancer (HCC)    skin   Headache 2024   Hypercholesterolemia    Past Surgical History:  Procedure Laterality Date   cataract surgery     COLONOSCOPY WITH PROPOFOL N/A 01/06/2016   Procedure: COLONOSCOPY WITH PROPOFOL;  Surgeon: Christena Deem, MD;  Location: [facility] ENDOSCOPY;  Service: Endoscopy;  Laterality: N/A;   COLONOSCOPY WITH PROPOFOL N/A 08/03/2023   Procedure: COLONOSCOPY WITH PROPOFOL;  Surgeon: Regis Bill, MD;  Location: [facility] ENDOSCOPY;  Service: Endoscopy;  Laterality: N/A;   EYE SURGERY     HERNIA REPAIR  11/30/1981   HERNIA REPAIR     INGUINAL HERNIA REPAIR Right 06/25/2015   Procedure: HERNIA REPAIR INGUINAL ADULT;  Surgeon: Nadeen Landau, MD;  Location: [facility] ORS;  Service: General;  Laterality: Right;   plate in rt forearm Right    Rod placed Rt leg Right    SKIN CANCER EXCISION  02/28/2014   BCCA-shoulder   XI ROBOTIC ASSISTED INGUINAL HERNIA REPAIR WITH MESH Left 04/12/2023   Procedure: XI ROBOTIC ASSISTED INGUINAL HERNIA REPAIR WITH MESH;  Surgeon: Carolan Shiver, MD;  Location: [facility] ORS;  Service: General;  Laterality: Left;   Family History  Problem Relation Age of Onset   Diabetes Mother    Diabetes Brother    Diabetes Brother    Prostate cancer Neg Hx    Colon cancer Neg Hx    Social History    Socioeconomic History   Marital status: Married    Spouse name: Not on file   Number of children: 2   Years of education: Not on file   Highest education level: Not on file  Occupational History   Not on file  Tobacco Use   Smoking status: Never   Smokeless tobacco: Never  Vaping Use   Vaping status: Never Used  Substance and Sexual Activity   Alcohol use: Not Currently    Comment: rare   Drug use: No   Sexual activity: Yes  Other Topics Concern   Not on file  Social History Narrative   Not on file   Social Determinants of Health   Financial Resource Strain: Low Risk  (05/08/2022)   Overall Financial Resource Strain (CARDIA)    Difficulty of Paying Living Expenses: Not hard at all  Food Insecurity: No Food Insecurity (05/08/2022)   Hunger Vital Sign    Worried About Running Out of Food in the Last Year: Never true    Ran Out of Food in the Last Year: Never true  Transportation Needs: No Transportation Needs (05/08/2022)   PRAPARE - Administrator, Civil Service (Medical): No    Lack of Transportation (Non-Medical): No  Physical Activity: Sufficiently Active (05/08/2022)   Exercise Vital Sign    Days of Exercise per Week: 3 days  Minutes of Exercise per Session: 60 min  Stress: No Stress Concern Present (05/08/2022)   [employer] of Occupational Health - Occupational Stress Questionnaire    Feeling of Stress : Not at all  Social Connections: Unknown (05/08/2022)   Social Connection and Isolation Panel [NHANES]    Frequency of Communication with Friends and Family: More than three times a week    Frequency of Social Gatherings with Friends and Family: More than three times a week    Attends Religious Services: Not on Marketing executive or Organizations: Not on file    Attends Banker Meetings: Not on file    Marital Status: Married     Review of Systems  Constitutional:  Negative for appetite change and unexpected weight change.   HENT:  Negative for congestion and sinus pressure.   Respiratory:  Negative for cough, chest tightness and shortness of breath.   Cardiovascular:  Negative for chest pain and palpitations.  Gastrointestinal:  Negative for abdominal pain, diarrhea, nausea and vomiting.  Genitourinary:  Negative for difficulty urinating and dysuria.       Urinary hesitancy  Musculoskeletal:  Negative for joint swelling and myalgias.  Skin:  Negative for color change and rash.  Neurological:  Negative for dizziness and headaches.  Psychiatric/Behavioral:  Negative for agitation and dysphoric mood.        Objective:     BP 120/70   Pulse 79   Temp 98 F (36.7 C)   Resp 16   Ht 5\' 10"  (1.778 m)   Wt 163 lb 6.4 oz (74.1 kg)   SpO2 98%   BMI 23.45 kg/m  Wt Readings from Last 3 Encounters:  10/04/23 163 lb 6.4 oz (74.1 kg)  08/03/23 159 lb 5.2 oz (72.3 kg)  05/05/23 163 lb (73.9 kg)    Physical Exam Vitals reviewed.  Constitutional:      General: He is not in acute distress.    Appearance: Normal appearance. He is well-developed.  HENT:     Head: Normocephalic and atraumatic.     Right Ear: External ear normal.     Left Ear: External ear normal.  Eyes:     General: No scleral icterus.       Right eye: No discharge.        Left eye: No discharge.     Conjunctiva/sclera: Conjunctivae normal.  Cardiovascular:     Rate and Rhythm: Normal rate and regular rhythm.  Pulmonary:     Effort: Pulmonary effort is normal. No respiratory distress.     Breath sounds: Normal breath sounds.  Abdominal:     General: Bowel sounds are normal.     Palpations: Abdomen is soft.     Tenderness: There is no abdominal tenderness.  Musculoskeletal:        General: No swelling or tenderness.     Cervical back: Neck supple. No tenderness.  Lymphadenopathy:     Cervical: No cervical adenopathy.  Skin:    Findings: No erythema or rash.  Neurological:     Mental Status: He is alert.  Psychiatric:         Mood and Affect: Mood normal.        Behavior: Behavior normal.      Outpatient Encounter Medications as of 10/04/2023  Medication Sig   B Complex Vitamins (B COMPLEX 1 PO) Take 1 tablet by mouth daily.   Cholecalciferol (VITAMIN D-3) 25 MCG (1000 UT) CAPS Take 1 capsule  by mouth daily.   Magnesium 400 MG TABS Take 1 tablet by mouth daily.   rosuvastatin (CRESTOR) 5 MG tablet Take 1 tablet (5 mg total) by mouth daily. D/c 10   No facility-administered encounter medications on file as of 10/04/2023.     Lab Results  Component Value Date   WBC 4.7 04/09/2023   HGB 13.2 04/09/2023   HCT 39.3 04/09/2023   PLT 206 04/09/2023   GLUCOSE 91 10/04/2023   CHOL 130 10/04/2023   TRIG 146.0 10/04/2023   HDL 42.10 10/04/2023   LDLCALC 59 10/04/2023   ALT 14 10/04/2023   AST 18 10/04/2023   NA 139 10/04/2023   K 4.4 10/04/2023   CL 102 10/04/2023   CREATININE 1.22 10/04/2023   BUN 20 10/04/2023   CO2 29 10/04/2023   TSH 4.41 10/04/2023   PSA 1.39 10/04/2023    No results found.     Assessment & Plan:  Routine general medical examination at a health care facility  Hypercholesterolemia Assessment & Plan: Low cholesterol diet and exercise.  On crestor.  Follow lipid panel and liver function tests.    Orders: -     Basic metabolic panel -     Hepatic function panel -     Lipid panel -     TSH  Urinary hesitancy -     Urinalysis, Routine w reflex microscopic -     Urine Culture  Need for influenza vaccination -     Flu Vaccine Trivalent High Dose (Fluad)  B12 deficiency -     Vitamin B12  Prostate cancer screening -     PSA, Medicare  History of colonic polyps Assessment & Plan: Colonoscopy 08/2023 - diverticulosis, internal hemorrhoids.  No f/u colonoscopy.    Light headedness Assessment & Plan: Had been having issues with light headedness/"feeling off".  MRI brain 08/17/22 - no acute abnormality.  Saw ENT and ophthalmology. Saw Dr Sherryll Burger 10/27/22.   Symptoms felt to  be c/w migraine. Recommended magnesium and continuing crestor.  Started magnesium.  No acute episodes.        Dale Fairview, MD

## 2023-10-05 ENCOUNTER — Encounter: Payer: Self-pay | Admitting: Internal Medicine

## 2023-10-05 LAB — URINE CULTURE
MICRO NUMBER:: 15682092
Result:: NO GROWTH
SPECIMEN QUALITY:: ADEQUATE

## 2023-10-05 LAB — BASIC METABOLIC PANEL
BUN: 20 mg/dL (ref 6–23)
CO2: 29 meq/L (ref 19–32)
Calcium: 9.4 mg/dL (ref 8.4–10.5)
Chloride: 102 meq/L (ref 96–112)
Creatinine, Ser: 1.22 mg/dL (ref 0.40–1.50)
GFR: 56.68 mL/min — ABNORMAL LOW (ref >60.00)
Glucose, Bld: 91 mg/dL (ref 70–99)
Potassium: 4.4 meq/L (ref 3.5–5.1)
Sodium: 139 meq/L (ref 135–145)

## 2023-10-05 LAB — LIPID PANEL
Cholesterol: 130 mg/dL (ref 0–200)
HDL: 42.1 mg/dL (ref >39.00)
LDL Cholesterol: 59 mg/dL (ref 0–99)
NonHDL: 88.12
Total CHOL/HDL Ratio: 3
Triglycerides: 146 mg/dL (ref 0.0–149.0)
VLDL: 29.2 mg/dL (ref 0.0–40.0)

## 2023-10-05 LAB — URINALYSIS, ROUTINE W REFLEX MICROSCOPIC
Bilirubin Urine: NEGATIVE
Hgb urine dipstick: NEGATIVE
Ketones, ur: NEGATIVE
Leukocytes,Ua: NEGATIVE
Nitrite: NEGATIVE
RBC / HPF: NONE SEEN (ref 0–?)
Specific Gravity, Urine: 1.015 (ref 1.000–1.030)
Total Protein, Urine: NEGATIVE
Urine Glucose: NEGATIVE
Urobilinogen, UA: 0.2 (ref 0.0–1.0)
WBC, UA: NONE SEEN (ref 0–?)
pH: 6 (ref 5.0–8.0)

## 2023-10-05 LAB — HEPATIC FUNCTION PANEL
ALT: 14 U/L (ref 0–53)
AST: 18 U/L (ref 0–37)
Albumin: 4.5 g/dL (ref 3.5–5.2)
Alkaline Phosphatase: 39 U/L (ref 39–117)
Bilirubin, Direct: 0.1 mg/dL (ref 0.0–0.3)
Total Bilirubin: 0.4 mg/dL (ref 0.2–1.2)
Total Protein: 7 g/dL (ref 6.0–8.3)

## 2023-10-05 LAB — VITAMIN B12: Vitamin B-12: 678 pg/mL (ref 211–911)

## 2023-10-05 LAB — PSA, MEDICARE: PSA: 1.39 ng/mL (ref 0.10–4.00)

## 2023-10-05 LAB — TSH: TSH: 4.41 u[IU]/mL (ref 0.35–5.50)

## 2023-10-06 ENCOUNTER — Other Ambulatory Visit: Payer: Self-pay

## 2023-10-06 DIAGNOSIS — R944 Abnormal results of kidney function studies: Secondary | ICD-10-CM

## 2023-10-09 ENCOUNTER — Encounter: Payer: Self-pay | Admitting: Internal Medicine

## 2023-10-09 NOTE — Assessment & Plan Note (Signed)
Colonoscopy 08/2023 - diverticulosis, internal hemorrhoids.  No f/u colonoscopy.

## 2023-10-09 NOTE — Assessment & Plan Note (Signed)
Low cholesterol diet and exercise.  On crestor.  Follow lipid panel and liver function tests.  

## 2023-10-09 NOTE — Addendum Note (Signed)
Addended by: Charm Barges on: 10/09/2023 09:19 PM   Modules accepted: Level of Service

## 2023-10-09 NOTE — Assessment & Plan Note (Addendum)
Check psa today 

## 2023-10-09 NOTE — Assessment & Plan Note (Signed)
Had been having issues with light headedness/"feeling off".  MRI brain 08/17/22 - no acute abnormality.  Saw ENT and ophthalmology. Saw Dr Sherryll Burger 10/27/22.   Symptoms felt to be c/w migraine. Recommended magnesium and continuing crestor.  Started magnesium.  No acute episodes.

## 2023-10-20 ENCOUNTER — Ambulatory Visit: Payer: Medicare Other | Admitting: *Deleted

## 2023-10-20 VITALS — Ht 70.0 in | Wt 160.0 lb

## 2023-10-20 DIAGNOSIS — Z0000 Encounter for general adult medical examination without abnormal findings: Secondary | ICD-10-CM | POA: Diagnosis not present

## 2023-10-20 NOTE — Patient Instructions (Signed)
Mr. Delamater , Thank you for taking time to come for your Medicare Wellness Visit. I appreciate your ongoing commitment to your health goals. Please review the following plan we discussed and let me know if I can assist you in the future.   Referrals/Orders/Follow-Ups/Clinician Recommendations: Remember to check into getting your second Shingles vaccine  This is a list of the screening recommended for you and due dates:  Health Maintenance  Topic Date Due   Zoster (Shingles) Vaccine (2 of 2) 03/05/2020   COVID-19 Vaccine (3 - Pfizer risk series) 10/20/2023*   DTaP/Tdap/Td vaccine (3 - Td or Tdap) 07/30/2024   Medicare Annual Wellness Visit  10/19/2024   Pneumonia Vaccine  Completed   Flu Shot  Completed   Hepatitis C Screening  Completed   HPV Vaccine  Aged Out   Colon Cancer Screening  Discontinued  *Topic was postponed. The date shown is not the original due date.    Advanced directives: (Copy Requested) Please bring a copy of your health care power of attorney and living will to the office to be added to your chart at your convenience.   Next Medicare Annual Wellness Visit scheduled for next year: Yes 10/20/24 @ 8:10

## 2023-10-20 NOTE — Progress Notes (Signed)
Subjective:   Troy Arnold is a 77 y.o. male who presents for Medicare Annual/Subsequent preventive examination.  Visit Complete: Virtual I connected with  Troy Arnold on 10/20/23 by a audio enabled telemedicine application and verified that I am speaking with the correct person using two identifiers.  Patient Location: Home  Provider Location: Home Office  I discussed the limitations of evaluation and management by telemedicine. The patient expressed understanding and agreed to proceed.  Vital Signs: Because this visit was a virtual/telehealth visit, some criteria may be missing or patient reported. Any vitals not documented were not able to be obtained and vitals that have been documented are patient reported. Cardiac Risk Factors include: advanced age (>58men, >48 women);male gender;dyslipidemia;Other (see comment), Risk factor comments: PVC     Objective:    Today's Vitals   10/20/23 0815  Weight: 160 lb (72.6 kg)  Height: 5\' 10"  (1.778 m)   Body mass index is 22.96 kg/m.     10/20/2023    8:25 AM 08/03/2023    8:16 AM 04/12/2023    6:11 AM 04/08/2023    2:40 PM 05/08/2022    9:24 AM 04/22/2021    3:58 PM 03/14/2020   11:26 AM  Advanced Directives  Does Patient Have a Medical Advance Directive? Yes Yes Yes Yes Yes Yes Yes  Type of Estate agent of [place];Living will Healthcare Power of [place];Living will Living will;Healthcare Power of [employer] Power of [place];Living will Healthcare Power of [place];Living will Healthcare Power of [place];Living will Healthcare Power of Attorney  Does patient want to make changes to medical advance directive?   No - Patient declined No - Patient declined No - Patient declined No - Patient declined No - Patient declined  Copy of Healthcare Power of Attorney in Chart? No - copy requested  No - copy requested No - copy requested No - copy requested No - copy requested No - copy requested     Current Medications (verified) Outpatient Encounter Medications as of 10/20/2023  Medication Sig   B Complex Vitamins (B COMPLEX 1 PO) Take 1 tablet by mouth daily.   Cholecalciferol (VITAMIN D-3) 25 MCG (1000 UT) CAPS Take 1 capsule by mouth daily.   Magnesium 400 MG TABS Take 1 tablet by mouth daily.   rosuvastatin (CRESTOR) 5 MG tablet Take 1 tablet (5 mg total) by mouth daily. D/c 10   No facility-administered encounter medications on file as of 10/20/2023.    Allergies (verified) Patient has no known allergies.   History: Past Medical History:  Diagnosis Date   Anemia    Cancer (HCC)    skin   Headache 2024   Hypercholesterolemia    Past Surgical History:  Procedure Laterality Date   cataract surgery     COLONOSCOPY WITH PROPOFOL N/A 01/06/2016   Procedure: COLONOSCOPY WITH PROPOFOL;  Surgeon: Christena Deem, MD;  Location: [facility] ENDOSCOPY;  Service: Endoscopy;  Laterality: N/A;   COLONOSCOPY WITH PROPOFOL N/A 08/03/2023   Procedure: COLONOSCOPY WITH PROPOFOL;  Surgeon: Regis Bill, MD;  Location: [facility] ENDOSCOPY;  Service: Endoscopy;  Laterality: N/A;   EYE SURGERY     HERNIA REPAIR  11/30/1981   HERNIA REPAIR     INGUINAL HERNIA REPAIR Right 06/25/2015   Procedure: HERNIA REPAIR INGUINAL ADULT;  Surgeon: Nadeen Landau, MD;  Location: [facility] ORS;  Service: General;  Laterality: Right;   plate in rt forearm Right    Rod placed Rt leg Right  SKIN CANCER EXCISION  02/28/2014   BCCA-shoulder   XI ROBOTIC ASSISTED INGUINAL HERNIA REPAIR WITH MESH Left 04/12/2023   Procedure: XI ROBOTIC ASSISTED INGUINAL HERNIA REPAIR WITH MESH;  Surgeon: Carolan Shiver, MD;  Location: [facility] ORS;  Service: General;  Laterality: Left;   Family History  Problem Relation Age of Onset   Diabetes Mother    Diabetes Brother    Diabetes Brother    Prostate cancer Neg Hx    Colon cancer Neg Hx    Social History   Socioeconomic History   Marital status: Married     Spouse name: Not on file   Number of children: 2   Years of education: Not on file   Highest education level: Not on file  Occupational History   Not on file  Tobacco Use   Smoking status: Never   Smokeless tobacco: Never  Vaping Use   Vaping status: Never Used  Substance and Sexual Activity   Alcohol use: Not Currently    Comment: rare   Drug use: No   Sexual activity: Yes  Other Topics Concern   Not on file  Social History Narrative   Married   Social Determinants of Health   Financial Resource Strain: Low Risk  (10/20/2023)   Overall Financial Resource Strain (CARDIA)    Difficulty of Paying Living Expenses: Not hard at all  Food Insecurity: No Food Insecurity (10/20/2023)   Hunger Vital Sign    Worried About Running Out of Food in the Last Year: Never true    Ran Out of Food in the Last Year: Never true  Transportation Needs: No Transportation Needs (10/20/2023)   PRAPARE - Administrator, Civil Service (Medical): No    Lack of Transportation (Non-Medical): No  Physical Activity: Sufficiently Active (10/20/2023)   Exercise Vital Sign    Days of Exercise per Week: 3 days    Minutes of Exercise per Session: 60 min  Stress: No Stress Concern Present (10/20/2023)   [employer] of Occupational Health - Occupational Stress Questionnaire    Feeling of Stress : Not at all  Social Connections: Socially Integrated (10/20/2023)   Social Connection and Isolation Panel [NHANES]    Frequency of Communication with Friends and Family: More than three times a week    Frequency of Social Gatherings with Friends and Family: More than three times a week    Attends Religious Services: More than 4 times per year    Active Member of [employer] or Organizations: Yes    Attends Engineer, structural: More than 4 times per year    Marital Status: Married    Tobacco Counseling Counseling given: Not Answered   Clinical Intake:  Pre-visit preparation completed:  Yes  Pain : No/denies pain     BMI - recorded: 22.96 Nutritional Status: BMI of 19-24  Normal Nutritional Risks: None Diabetes: No  How often do you need to have someone help you when you read instructions, pamphlets, or other written materials from your doctor or pharmacy?: 1 - Never  Interpreter Needed?: No  Information entered by :: R. Kimi Bordeau LPN   Activities of Daily Living    10/20/2023    8:17 AM 04/08/2023    2:42 PM  In your present state of health, do you have any difficulty performing the following activities:  Hearing? 0   Vision? 0   Comment readers   Difficulty concentrating or making decisions? 0   Walking or climbing stairs? 0  Dressing or bathing? 0   Doing errands, shopping? 0 0  Preparing Food and eating ? N   Using the Toilet? N   In the past six months, have you accidently leaked urine? N   Do you have problems with loss of bowel control? N   Managing your Medications? N   Managing your Finances? N   Housekeeping or managing your Housekeeping? N     Patient Care Team: Dale Clarkfield, MD as PCP - General (Internal Medicine)  Indicate any recent Medical Services you may have received from other than [facility] providers in the past year (date may be approximate).     Assessment:   This is a routine wellness examination for Sharrieff.  Hearing/Vision screen Hearing Screening - Comments:: No issues Vision Screening - Comments:: readers   Goals Addressed             This Visit's Progress    Patient Stated       Continue to exercise and get out       Depression Screen    10/20/2023    8:21 AM 11/02/2022    8:40 AM 08/05/2022    1:19 PM 05/14/2022    1:13 PM 05/08/2022    9:05 AM 09/19/2021    9:09 AM 04/22/2021    3:54 PM  PHQ 2/9 Scores  PHQ - 2 Score 0 0 0 0 0 0 0  PHQ- 9 Score 0          Fall Risk    10/20/2023    8:18 AM 11/02/2022    8:40 AM 08/05/2022    1:18 PM 05/14/2022    1:13 PM 05/08/2022    9:09 AM  Fall Risk   Falls in the  past year? 0 0 0 0 0  Number falls in past yr: 0 0 0 0   Injury with Fall? 0 0 0 0   Risk for fall due to : No Fall Risks No Fall Risks No Fall Risks No Fall Risks   Follow up Falls prevention discussed;Falls evaluation completed Falls evaluation completed Falls evaluation completed Falls evaluation completed Falls evaluation completed    MEDICARE RISK AT HOME: Medicare Risk at Home Any stairs in or around the home?: No If so, are there any without handrails?: No Home free of loose throw rugs in walkways, pet beds, electrical cords, etc?: Yes Adequate lighting in your home to reduce risk of falls?: Yes Life alert?: No Use of a cane, walker or w/c?: No Grab bars in the bathroom?: Yes Shower chair or bench in shower?: Yes Elevated toilet seat or a handicapped toilet?: Yes      Cognitive Function:    09/05/2015    9:11 AM  MMSE - Mini Mental State Exam  Orientation to time 5  Orientation to Place 5  Registration 3  Attention/ Calculation 5  Recall 3  Language- name 2 objects 2  Language- repeat 1  Language- follow 3 step command 3  Language- read & follow direction 1  Write a sentence 1  Copy design 1  Total score 30        10/20/2023    8:25 AM 03/14/2020   11:55 AM  6CIT Screen  What Year? 0 points 0 points  What month? 0 points 0 points  What time? 0 points 0 points  Count back from 20 0 points 0 points  Months in reverse 0 points 0 points  Repeat phrase 0 points   Total Score 0  points     Immunizations Immunization History  Administered Date(s) Administered   Fluad Quad(high Dose 65+) 09/26/2020, 10/16/2021, 09/21/2022   Fluad Trivalent(High Dose 65+) 10/04/2023   Hepatitis B 04/08/2010, 05/12/2010, 10/08/2010   Influenza Split 07/30/2014   Influenza, High Dose Seasonal PF 09/17/2016, 09/13/2017, 09/19/2018   Influenza,inj,Quad PF,6+ Mos 09/05/2015, 09/08/2019   Influenza-Unspecified 09/08/2011, 09/26/2020, 08/30/2021, 08/30/2022   PFIZER(Purple  Top)SARS-COV-2 Vaccination 12/25/2019, 01/15/2020   PNEUMOCOCCAL CONJUGATE-20 08/30/2022   Pneumococcal Conjugate-13 09/05/2015   Pneumococcal Polysaccharide-23 09/20/2017   Td 07/30/2014   Tdap 07/30/2014   Zoster Recombinant(Shingrix) 01/09/2020    TDAP status: Up to date  Flu Vaccine status: Up to date  Pneumococcal vaccine status: Up to date  Covid-19 vaccine status: Information provided on how to obtain vaccines.   Qualifies for Shingles Vaccine? Yes   Zostavax completed No   Shingrix Completed?: No.    Education has been provided regarding the importance of this vaccine. Patient has been advised to call insurance company to determine out of pocket expense if they have not yet received this vaccine. Advised may also receive vaccine at local pharmacy or Health Dept. Verbalized acceptance and understanding. Patient has had one and will check into getting the second one.  Screening Tests Health Maintenance  Topic Date Due   Zoster Vaccines- Shingrix (2 of 2) 03/05/2020   Medicare Annual Wellness (AWV)  05/09/2023   COVID-19 Vaccine (3 - Pfizer risk series) 10/20/2023 (Originally 02/12/2020)   DTaP/Tdap/Td (3 - Td or Tdap) 07/30/2024   Pneumonia Vaccine 64+ Years old  Completed   INFLUENZA VACCINE  Completed   Hepatitis C Screening  Completed   HPV VACCINES  Aged Out   Colonoscopy  Discontinued    Health Maintenance  Health Maintenance Due  Topic Date Due   Zoster Vaccines- Shingrix (2 of 2) 03/05/2020   Medicare Annual Wellness (AWV)  05/09/2023    Colorectal cancer screening: No longer required.   Lung Cancer Screening: (Low Dose CT Chest recommended if Age 53-80 years, 20 pack-year currently smoking OR have quit w/in 15years.) does not qualify.     Additional Screening:  Hepatitis C Screening: does qualify; Completed 08/2015  Vision Screening: Recommended annual ophthalmology exams for early detection of glaucoma and other disorders of the eye. Is the patient  up to date with their annual eye exam?  Yes  Who is the provider or what is the name of the office in which the patient attends annual eye exams? Dr. Alvester Morin If pt is not established with a provider, would they like to be referred to a provider to establish care? No .   Dental Screening: Recommended annual dental exams for proper oral hygiene   Community Resource Referral / Chronic Care Management: CRR required this visit?  No   CCM required this visit?  No     Plan:     I have personally reviewed and noted the following in the patient's chart:   Medical and social history Use of alcohol, tobacco or illicit drugs  Current medications and supplements including opioid prescriptions. Patient is not currently taking opioid prescriptions. Functional ability and status Nutritional status Physical activity Advanced directives List of other physicians Hospitalizations, surgeries, and ER visits in previous 12 months Vitals Screenings to include cognitive, depression, and falls Referrals and appointments  In addition, I have reviewed and discussed with patient certain preventive protocols, quality metrics, and best practice recommendations. A written personalized care plan for preventive services as well as general preventive health  recommendations were provided to patient.     Sydell Axon, LPN   16/08/9603   After Visit Summary: (MyChart) Due to this being a telephonic visit, the after visit summary with patients personalized plan was offered to patient via MyChart   Nurse Notes: None

## 2023-11-15 ENCOUNTER — Other Ambulatory Visit (INDEPENDENT_AMBULATORY_CARE_PROVIDER_SITE_OTHER): Payer: Medicare Other

## 2023-11-15 DIAGNOSIS — R944 Abnormal results of kidney function studies: Secondary | ICD-10-CM | POA: Diagnosis not present

## 2023-11-15 LAB — BASIC METABOLIC PANEL
BUN: 17 mg/dL (ref 6–23)
CO2: 28 meq/L (ref 19–32)
Calcium: 8.8 mg/dL (ref 8.4–10.5)
Chloride: 105 meq/L (ref 96–112)
Creatinine, Ser: 1.17 mg/dL (ref 0.40–1.50)
GFR: 59.55 mL/min — ABNORMAL LOW (ref >60.00)
Glucose, Bld: 89 mg/dL (ref 70–99)
Potassium: 4.1 meq/L (ref 3.5–5.1)
Sodium: 141 meq/L (ref 135–145)

## 2023-12-27 ENCOUNTER — Ambulatory Visit (INDEPENDENT_AMBULATORY_CARE_PROVIDER_SITE_OTHER): Payer: Medicare Other

## 2023-12-27 ENCOUNTER — Encounter: Payer: Self-pay | Admitting: Family Medicine

## 2023-12-27 ENCOUNTER — Ambulatory Visit: Payer: Medicare Other | Admitting: Family Medicine

## 2023-12-27 VITALS — BP 118/68 | HR 93 | Temp 98.1°F | Resp 18 | Ht 70.0 in | Wt 167.1 lb

## 2023-12-27 DIAGNOSIS — R051 Acute cough: Secondary | ICD-10-CM

## 2023-12-27 DIAGNOSIS — R059 Cough, unspecified: Secondary | ICD-10-CM | POA: Insufficient documentation

## 2023-12-27 MED ORDER — AMOXICILLIN-POT CLAVULANATE 875-125 MG PO TABS: 1.0000 | ORAL_TABLET | Freq: Two times a day (BID) | ORAL | 0 refills | Status: DC

## 2023-12-27 NOTE — Assessment & Plan Note (Signed)
Patient has a distinct area of crackles in his right lower lung field.  This is concerning for possible pneumonia.  Given his cough and this exam finding we will proceed with Augmentin 1 tablet twice daily for a week.  Will also get a chest x-ray.  He will seek medical attention if he develops shortness of breath, fever, or any worsening symptoms.  He will contact us if he develops any diarrhea from the antibiotic.

## 2023-12-27 NOTE — Progress Notes (Signed)
  Marikay Alar, MD Phone: [phone]  Troy Arnold is a 78 y.o. male who presents today for same-day visit.  Cough/congestion: Patient notes onset of symptoms about 3 days ago.  He was working under the house with heat out and then the next day started to feel congested in his head and then congested in his chest.  He is blowing out clear/yellow mucus from his nose.  He is not able to cough much up.  No fevers.  Does have postnasal drip.  No sore throat or shortness of breath.  He has tried Mucinex and [facility] cough and cold medication with little benefit.  He notes a negative COVID test at home earlier today.  Social History   Tobacco Use  Smoking Status Never  Smokeless Tobacco Never    Current Outpatient Medications on File Prior to Visit  Medication Sig Dispense Refill   B Complex Vitamins (B COMPLEX 1 PO) Take 1 tablet by mouth daily.     Cholecalciferol (VITAMIN D-3) 25 MCG (1000 UT) CAPS Take 1 capsule by mouth daily.     Magnesium 400 MG TABS Take 1 tablet by mouth daily.     rosuvastatin (CRESTOR) 5 MG tablet Take 1 tablet (5 mg total) by mouth daily. D/c 10 90 tablet 3   No current facility-administered medications on file prior to visit.     ROS see history of present illness  Objective  Physical Exam Vitals:   12/27/23 1316  BP: 118/68  Pulse: 93  Resp: 18  Temp: 98.1 F (36.7 C)  SpO2: 98%    BP Readings from Last 3 Encounters:  12/27/23 118/68  10/04/23 120/70  08/16/23 128/73   Wt Readings from Last 3 Encounters:  12/27/23 167 lb 2 oz (75.8 kg)  10/20/23 160 lb (72.6 kg)  10/04/23 163 lb 6.4 oz (74.1 kg)    Physical Exam Constitutional:      General: He is not in acute distress.    Appearance: He is not diaphoretic.  Cardiovascular:     Rate and Rhythm: Normal rate and regular rhythm.     Heart sounds: Normal heart sounds.  Pulmonary:     Effort: Pulmonary effort is normal.     Comments: Crackles noted right lower lung field, other  lung fields are clear Skin:    General: Skin is warm and dry.  Neurological:     Mental Status: He is alert.      Assessment/Plan: Please see individual problem list.  Acute cough Assessment & Plan: Patient has a distinct area of crackles in his right lower lung field.  This is concerning for possible pneumonia.  Given his cough and this exam finding we will proceed with Augmentin 1 tablet twice daily for a week.  Will also get a chest x-ray.  He will seek medical attention if he develops shortness of breath, fever, or any worsening symptoms.  He will contact us if he develops any diarrhea from the antibiotic.  Orders: -     Amoxicillin-Pot Clavulanate; Take 1 tablet by mouth 2 (two) times daily.  Dispense: 14 tablet; Refill: 0 -     DG Chest 2 View; Future   Return if symptoms worsen or fail to improve.   Marikay Alar, MD [facility]

## 2024-01-19 ENCOUNTER — Encounter: Payer: Self-pay | Admitting: Podiatry

## 2024-01-19 ENCOUNTER — Ambulatory Visit: Payer: Medicare Other | Admitting: Podiatry

## 2024-01-19 VITALS — Ht 70.0 in | Wt 167.1 lb

## 2024-01-19 DIAGNOSIS — M7742 Metatarsalgia, left foot: Secondary | ICD-10-CM

## 2024-01-19 DIAGNOSIS — M7741 Metatarsalgia, right foot: Secondary | ICD-10-CM

## 2024-01-19 DIAGNOSIS — L84 Corns and callosities: Secondary | ICD-10-CM | POA: Diagnosis not present

## 2024-01-19 NOTE — Progress Notes (Signed)
  Subjective:  [name], male    DOB: [birth year],  MRN: [id number]  Chief Complaint  Patient presents with   Callouses    Pt is here due to callouses on bilateral feet, that he would like shaved off. States that this is a recurrent issue.    78 y.o. male presents with the above complaint. History confirmed with patient.  Calluses have returned again and are painful  Objective:  Physical Exam: warm, good capillary refill, no trophic changes or ulcerative lesions, normal DP and PT pulses, normal sensory exam, and painful hyperkeratosis submetatarsal 5 bilateral.  Prominence of the metatarsal head here  Assessment:   1. Callus of foot   2. Metatarsalgia of both feet      Plan:  Patient was evaluated and treated and all questions answered.  We discussed etiology and treatment options of hyperkeratotic lesions in this particular area.    I debrided the lesion as a courtesy today and applied salinocaine he will leave it on for 24 hours.  We again discussed custom molded foot orthosis he will let me know if he would like to proceed with this.  Long-term I also discussed that fifth metatarsal osteotomy could increase relief, he says he would prefer to avoid surgery at this point at his age Return in about 4 months (around 05/18/2024) for bilateral callus .

## 2024-02-02 ENCOUNTER — Ambulatory Visit (INDEPENDENT_AMBULATORY_CARE_PROVIDER_SITE_OTHER): Payer: Medicare Other | Admitting: Internal Medicine

## 2024-02-02 ENCOUNTER — Encounter: Payer: Self-pay | Admitting: Internal Medicine

## 2024-02-02 VITALS — BP 110/70 | HR 84 | Temp 98.0°F | Resp 16 | Ht 70.0 in | Wt 165.4 lb

## 2024-02-02 DIAGNOSIS — R059 Cough, unspecified: Secondary | ICD-10-CM

## 2024-02-02 DIAGNOSIS — Z862 Personal history of diseases of the blood and blood-forming organs and certain disorders involving the immune mechanism: Secondary | ICD-10-CM

## 2024-02-02 DIAGNOSIS — Z0000 Encounter for general adult medical examination without abnormal findings: Secondary | ICD-10-CM | POA: Diagnosis not present

## 2024-02-02 DIAGNOSIS — E7800 Pure hypercholesterolemia, unspecified: Secondary | ICD-10-CM | POA: Diagnosis not present

## 2024-02-02 DIAGNOSIS — E785 Hyperlipidemia, unspecified: Secondary | ICD-10-CM

## 2024-02-02 DIAGNOSIS — R42 Dizziness and giddiness: Secondary | ICD-10-CM

## 2024-02-02 LAB — LIPID PANEL
Cholesterol: 150 mg/dL (ref 0–200)
HDL: 44.8 mg/dL (ref >39.00)
LDL Cholesterol: 81 mg/dL (ref 0–99)
NonHDL: 104.7
Total CHOL/HDL Ratio: 3
Triglycerides: 121 mg/dL (ref 0.0–149.0)
VLDL: 24.2 mg/dL (ref 0.0–40.0)

## 2024-02-02 LAB — CBC WITH DIFFERENTIAL/PLATELET
Basophils Absolute: 0 10*3/uL (ref 0.0–0.1)
Basophils Relative: 0.5 % (ref 0.0–3.0)
Eosinophils Absolute: 0.2 10*3/uL (ref 0.0–0.7)
Eosinophils Relative: 4.3 % (ref 0.0–5.0)
HCT: 43.3 % (ref 39.0–52.0)
Hemoglobin: 14.4 g/dL (ref 13.0–17.0)
Lymphocytes Relative: 25.1 % (ref 12.0–46.0)
Lymphs Abs: 1.4 10*3/uL (ref 0.7–4.0)
MCHC: 33.2 g/dL (ref 30.0–36.0)
MCV: 95.2 fl (ref 78.0–100.0)
Monocytes Absolute: 0.6 10*3/uL (ref 0.1–1.0)
Monocytes Relative: 11.7 % (ref 3.0–12.0)
Neutro Abs: 3.2 10*3/uL (ref 1.4–7.7)
Neutrophils Relative %: 58.4 % (ref 43.0–77.0)
Platelets: 230 10*3/uL (ref 150.0–400.0)
RBC: 4.55 Mil/uL (ref 4.22–5.81)
RDW: 13.7 % (ref 11.5–15.5)
WBC: 5.4 10*3/uL (ref 4.0–10.5)

## 2024-02-02 LAB — HEPATIC FUNCTION PANEL
ALT: 16 U/L (ref 0–53)
AST: 18 U/L (ref 0–37)
Albumin: 4.5 g/dL (ref 3.5–5.2)
Alkaline Phosphatase: 37 U/L — ABNORMAL LOW (ref 39–117)
Bilirubin, Direct: 0.1 mg/dL (ref 0.0–0.3)
Total Bilirubin: 0.6 mg/dL (ref 0.2–1.2)
Total Protein: 7.3 g/dL (ref 6.0–8.3)

## 2024-02-02 LAB — BASIC METABOLIC PANEL
BUN: 22 mg/dL (ref 6–23)
CO2: 30 meq/L (ref 19–32)
Calcium: 9.3 mg/dL (ref 8.4–10.5)
Chloride: 103 meq/L (ref 96–112)
Creatinine, Ser: 1.15 mg/dL (ref 0.40–1.50)
GFR: 60.7 mL/min (ref >60.00)
Glucose, Bld: 100 mg/dL — ABNORMAL HIGH (ref 70–99)
Potassium: 4.2 meq/L (ref 3.5–5.1)
Sodium: 139 meq/L (ref 135–145)

## 2024-02-02 MED ORDER — ROSUVASTATIN CALCIUM 5 MG PO TABS: 5.0000 mg | ORAL_TABLET | Freq: Every day | ORAL | 3 refills | Status: AC

## 2024-02-02 NOTE — Assessment & Plan Note (Signed)
 Physical 02/02/24. Colonoscopy 08/03/23.  PSA 1.39 - 10/04/23.

## 2024-02-02 NOTE — Assessment & Plan Note (Signed)
 MRI brain 08/17/22 - no acute abnormality.  Saw ENT and ophthalmology. Saw Dr Sherryll Burger 10/27/22.   Symptoms felt to be c/w migraine. Recommended magnesium and continuing crestor.  Taking magnesium regularly. No significant light headedness. Doing well.

## 2024-02-02 NOTE — Assessment & Plan Note (Signed)
Continues on crestor.  Low cholesterol diet and exercise.  Follow lipid panel and liver function tests.

## 2024-02-02 NOTE — Progress Notes (Signed)
 Subjective:    [name], male    DOB: 12-19-1945, 78 y.o.   MRN: [id number]  Patient here for  Chief Complaint  Patient presents with   Annual Exam    HPI Here for a physical exam. Saw neurology - diagnosed with migraine headache. Continues magnesium glycinate. Saw podiatry - callus of foot. Reports he is doing well. Stays active. No chest pain or sob reported. Previous cough and congestion resolved. No abdominal pain or bowel change.    Past Medical History:  Diagnosis Date   Anemia    Cancer (HCC)    skin   Headache 2024   Hypercholesterolemia    Past Surgical History:  Procedure Laterality Date   cataract surgery     COLONOSCOPY WITH PROPOFOL N/A 01/06/2016   Procedure: COLONOSCOPY WITH PROPOFOL;  Surgeon: Christena Deem, MD;  Location: [facility] ENDOSCOPY;  Service: Endoscopy;  Laterality: N/A;   COLONOSCOPY WITH PROPOFOL N/A 08/03/2023   Procedure: COLONOSCOPY WITH PROPOFOL;  Surgeon: Regis Bill, MD;  Location: [facility] ENDOSCOPY;  Service: Endoscopy;  Laterality: N/A;   EYE SURGERY     HERNIA REPAIR  11/30/1981   HERNIA REPAIR     INGUINAL HERNIA REPAIR Right 06/25/2015   Procedure: HERNIA REPAIR INGUINAL ADULT;  Surgeon: Nadeen Landau, MD;  Location: [facility] ORS;  Service: General;  Laterality: Right;   plate in rt forearm Right    Rod placed Rt leg Right    SKIN CANCER EXCISION  02/28/2014   BCCA-shoulder   XI ROBOTIC ASSISTED INGUINAL HERNIA REPAIR WITH MESH Left 04/12/2023   Procedure: XI ROBOTIC ASSISTED INGUINAL HERNIA REPAIR WITH MESH;  Surgeon: Carolan Shiver, MD;  Location: [facility] ORS;  Service: General;  Laterality: Left;   Family History  Problem Relation Age of Onset   Diabetes Mother    Diabetes Brother    Diabetes Brother    Prostate cancer Neg Hx    Colon cancer Neg Hx    Social History   Socioeconomic History   Marital status: Married    Spouse name: Not on file   Number of children: 2   Years of education:  Not on file   Highest education level: Not on file  Occupational History   Not on file  Tobacco Use   Smoking status: Never   Smokeless tobacco: Never  Vaping Use   Vaping status: Never Used  Substance and Sexual Activity   Alcohol use: Not Currently    Comment: rare   Drug use: No   Sexual activity: Yes  Other Topics Concern   Not on file  Social History Narrative   Married   Social Drivers of Health   Financial Resource Strain: Low Risk  (10/20/2023)   Overall Financial Resource Strain (CARDIA)    Difficulty of Paying Living Expenses: Not hard at all  Food Insecurity: No Food Insecurity (10/20/2023)   Hunger Vital Sign    Worried About Running Out of Food in the Last Year: Never true    Ran Out of Food in the Last Year: Never true  Transportation Needs: No Transportation Needs (10/20/2023)   PRAPARE - Administrator, Civil Service (Medical): No    Lack of Transportation (Non-Medical): No  Physical Activity: Sufficiently Active (10/20/2023)   Exercise Vital Sign    Days of Exercise per Week: 3 days    Minutes of Exercise per Session: 60 min  Stress: No Stress Concern Present (10/20/2023)   [employer] of  Occupational Health - Occupational Stress Questionnaire    Feeling of Stress : Not at all  Social Connections: Socially Integrated (10/20/2023)   Social Connection and Isolation Panel [NHANES]    Frequency of Communication with Friends and Family: More than three times a week    Frequency of Social Gatherings with Friends and Family: More than three times a week    Attends Religious Services: More than 4 times per year    Active Member of [employer] or Organizations: Yes    Attends Engineer, structural: More than 4 times per year    Marital Status: Married     Review of Systems  Constitutional:  Negative for appetite change and unexpected weight change.  HENT:  Negative for congestion, sinus pressure and sore throat.   Eyes:  Negative for  pain and visual disturbance.  Respiratory:  Negative for cough, chest tightness and shortness of breath.   Cardiovascular:  Negative for chest pain, palpitations and leg swelling.  Gastrointestinal:  Negative for abdominal pain, diarrhea, nausea and vomiting.  Genitourinary:  Negative for difficulty urinating and dysuria.  Musculoskeletal:  Negative for joint swelling and myalgias.  Skin:  Negative for color change.       Followed by Dr Adolphus Birchwood  Neurological:  Negative for dizziness and headaches.  Hematological:  Negative for adenopathy. Does not bruise/bleed easily.  Psychiatric/Behavioral:  Negative for agitation and dysphoric mood.        Objective:     BP 110/70   Pulse 84   Temp 98 F (36.7 C)   Resp 16   Ht 5\' 10"  (1.778 m)   Wt 165 lb 6.4 oz (75 kg)   SpO2 99%   BMI 23.73 kg/m  Wt Readings from Last 3 Encounters:  02/02/24 165 lb 6.4 oz (75 kg)  01/19/24 167 lb 2.1 oz (75.8 kg)  12/27/23 167 lb 2 oz (75.8 kg)    Physical Exam Constitutional:      General: He is not in acute distress.    Appearance: Normal appearance. He is well-developed.  HENT:     Head: Normocephalic and atraumatic.     Right Ear: External ear normal.     Left Ear: External ear normal.     Mouth/Throat:     Pharynx: No oropharyngeal exudate or posterior oropharyngeal erythema.  Eyes:     General: No scleral icterus.       Right eye: No discharge.        Left eye: No discharge.     Conjunctiva/sclera: Conjunctivae normal.  Neck:     Thyroid: No thyromegaly.  Cardiovascular:     Rate and Rhythm: Normal rate and regular rhythm.  Pulmonary:     Effort: No respiratory distress.     Breath sounds: Normal breath sounds. No wheezing.  Abdominal:     General: Bowel sounds are normal.     Palpations: Abdomen is soft.     Tenderness: There is no abdominal tenderness.  Musculoskeletal:        General: No swelling or tenderness.     Cervical back: Neck supple. No tenderness.  Lymphadenopathy:      Cervical: No cervical adenopathy.  Skin:    Findings: No erythema or rash.  Neurological:     Mental Status: He is alert and oriented to person, place, and time.  Psychiatric:        Mood and Affect: Mood normal.        Behavior: Behavior normal.  Outpatient Encounter Medications as of 02/02/2024  Medication Sig   B Complex Vitamins (B COMPLEX 1 PO) Take 1 tablet by mouth daily.   Cholecalciferol (VITAMIN D-3) 25 MCG (1000 UT) CAPS Take 1 capsule by mouth daily.   Magnesium 400 MG TABS Take 1 tablet by mouth daily.   rosuvastatin (CRESTOR) 5 MG tablet Take 1 tablet (5 mg total) by mouth daily.   [DISCONTINUED] amoxicillin-clavulanate (AUGMENTIN) 875-125 MG tablet Take 1 tablet by mouth 2 (two) times daily.   [DISCONTINUED] rosuvastatin (CRESTOR) 5 MG tablet Take 1 tablet (5 mg total) by mouth daily. D/c 10   No facility-administered encounter medications on file as of 02/02/2024.     Lab Results  Component Value Date   WBC 4.7 04/09/2023   HGB 13.2 04/09/2023   HCT 39.3 04/09/2023   PLT 206 04/09/2023   GLUCOSE 89 11/15/2023   CHOL 130 10/04/2023   TRIG 146.0 10/04/2023   HDL 42.10 10/04/2023   LDLCALC 59 10/04/2023   ALT 14 10/04/2023   AST 18 10/04/2023   NA 141 11/15/2023   K 4.1 11/15/2023   CL 105 11/15/2023   CREATININE 1.17 11/15/2023   BUN 17 11/15/2023   CO2 28 11/15/2023   TSH 4.41 10/04/2023   PSA 1.39 10/04/2023       Assessment & Plan:  Routine general medical examination at a health care facility  Hypercholesterolemia Assessment & Plan: Continues on crestor. Low cholesterol diet and exercise. Follow lipid panel and liver function tests.   Orders: -     Lipid panel -     Hepatic function panel -     Basic metabolic panel -     CBC with Differential/Platelet  History of anemia -     CBC with Differential/Platelet  Health care maintenance Assessment & Plan: Physical 02/02/24. Colonoscopy 08/03/23.  PSA 1.39 - 10/04/23.     Hyperlipidemia, unspecified hyperlipidemia type -     Rosuvastatin Calcium; Take 1 tablet (5 mg total) by mouth daily.  Dispense: 90 tablet; Refill: 3  Light headedness Assessment & Plan: MRI brain 08/17/22 - no acute abnormality.  Saw ENT and ophthalmology. Saw Dr Sherryll Burger 10/27/22.   Symptoms felt to be c/w migraine. Recommended magnesium and continuing crestor.  Taking magnesium regularly. No significant light headedness. Doing well.   Cough, unspecified type Assessment & Plan: Recently evaluated for persistent cough.  CXR - Coarsened interstitial markings, which may reflect bronchitic lung changes. No focal airspace consolidation. Symptoms resolved. No cough. Will plan f/u cxr in future.       Dale Chinese Camp, MD

## 2024-02-02 NOTE — Assessment & Plan Note (Signed)
 Recently evaluated for persistent cough.  CXR - Coarsened interstitial markings, which may reflect bronchitic lung changes. No focal airspace consolidation. Symptoms resolved. No cough. Will plan f/u cxr in future.

## 2024-05-22 ENCOUNTER — Ambulatory Visit: Payer: Medicare Other | Admitting: Podiatry

## 2024-05-22 ENCOUNTER — Encounter: Payer: Self-pay | Admitting: Podiatry

## 2024-05-22 DIAGNOSIS — M7741 Metatarsalgia, right foot: Secondary | ICD-10-CM | POA: Diagnosis not present

## 2024-05-22 DIAGNOSIS — L84 Corns and callosities: Secondary | ICD-10-CM | POA: Diagnosis not present

## 2024-05-22 DIAGNOSIS — M7742 Metatarsalgia, left foot: Secondary | ICD-10-CM | POA: Diagnosis not present

## 2024-05-22 NOTE — Progress Notes (Signed)
  Subjective:  [name], male    DOB: [birth year],  MRN: [id number]  Chief Complaint  Patient presents with   Callouses    I got these places on my feet that he cuts off so I can walk.    78 y.o. male presents with the above complaint. History confirmed with patient.  Calluses have returned again and are painful  Objective:  Physical Exam: warm, good capillary refill, no trophic changes or ulcerative lesions, normal DP and PT pulses, normal sensory exam, and painful hyperkeratosis submetatarsal 5 bilateral.  Prominence of the metatarsal head here  Assessment:   1. Callus of foot   2. Metatarsalgia of both feet       Plan:  Patient was evaluated and treated and all questions answered.  We discussed etiology and treatment options of hyperkeratotic lesions in this particular area.    I debrided the lesions sharply with a scalpel as a courtesy today and applied salinocaine he will leave it on for 24 hours.  Still would like to avoid surgery at this point   Return in 4 months (on 09/21/2024) for painful calluses.

## 2024-07-20 ENCOUNTER — Ambulatory Visit: Payer: Self-pay

## 2024-07-20 NOTE — Telephone Encounter (Signed)
 FYI pt going to urgent care to be evaluated.

## 2024-07-20 NOTE — Telephone Encounter (Signed)
 FYI Only or Action Required?: FYI only for provider.  Patient was last seen in primary care on 02/02/2024 by Glendia Shad, MD.  Called Nurse Triage reporting Influenza.  Symptoms began several days ago.  Interventions attempted: Nothing.  Symptoms are: gradually worsening.  Triage Disposition: Call PCP Within 24 Hours  Patient/caregiver understands and will follow disposition?: Yes                    Copied from CRM #[id number]. Topic: Clinical - Red Word Triage >> Jul 20, 2024  8:06 AM Adelita E wrote: [facility] that prompted transfer to Nurse Triage: Cold/flu symptoms. Wife, Arland, called in stating that husband is having body pain, congestion, cough, and a sore throat. Symptoms have been going on for 2 days, not improving. Reason for Disposition  Patient is HIGH RISK (e.g., 65 years and older, pregnant, HIV+, or chronic medical condition)  Answer Assessment - Initial Assessment Questions 1. SYMPTOMS: What is your main symptom or concern? (e.g., cough, fever, shortness of breath, muscle aches)     Body aches, congestion, cough, sore throat 2. ONSET: When did the symptoms start?      2 days ago 3. COUGH: Do you have a cough? If Yes, ask: How bad is the cough?       Progressively getting worse 4. FEVER: Do you have a fever? If Yes, ask: What is your temperature, how was it measured, and when did it start?     chills 5. BREATHING DIFFICULTY: Are you having any difficulty breathing? (e.g., normal; shortness of breath, wheezing, unable to speak)      denies 6. BETTER-SAME-WORSE: Are you getting better, staying the same or getting worse compared to yesterday?  If getting worse, ask, In what way?     Getting worse 7. OTHER SYMPTOMS: Do you have any other symptoms?  (e.g., chills, fatigue, headache, loss of smell or taste, muscle pain, sore throat)     denies 8. INFLUENZA EXPOSURE: Was there any known exposure to influenza (flu) before the  symptoms began?      At church several people was sick 24. INFLUENZA SUSPECTED: Why do you think you have influenza? (e.g., positive flu self-test at home, symptoms after exposure).     yes 10. INFLUENZA VACCINE: Have you had the flu vaccine? If Yes, ask: When did you last get it?       Last year 83. HIGH RISK FOR COMPLICATIONS: Do you have any chronic medical problems? (e.g., asthma, heart or lung disease, obesity, weak immune system)       no 12. PREGNANCY: Is there any chance you are pregnant? When was your last menstrual period?       na 13. O2 SATURATION MONITOR:  Do you use an oxygen saturation monitor (pulse oximeter) at home? If Yes, ask What is your reading (oxygen level) today? What is your usual oxygen saturation reading? (e.g., 95%)       na  Protocols used: Influenza (Flu) Suspected-A-AH

## 2024-07-23 ENCOUNTER — Ambulatory Visit
Admission: EM | Admit: 2024-07-23 | Discharge: 2024-07-23 | Disposition: A | Discharge status: Home / Self Care | Attending: Emergency Medicine | Admitting: Emergency Medicine

## 2024-07-23 DIAGNOSIS — U071 COVID-19: Secondary | ICD-10-CM

## 2024-07-23 LAB — POC SOFIA SARS ANTIGEN FIA: SARS Coronavirus 2 Ag: POSITIVE — AB

## 2024-07-23 NOTE — Discharge Instructions (Addendum)
 Take the Paxlovid as directed.  Take Tylenol  as needed for fever or discomfort.  Rest and keep yourself hydrated.    Stop your rosuvastatin  while taking Paxlovid and for 5 additional days after the Paxlovid is finished.   Go to the emergency department if you have shortness of breath or other concerning symptoms.    Contact your primary care provider to let them know that you are COVID positive and taking Paxlovid.

## 2024-07-23 NOTE — ED Triage Notes (Addendum)
 Patient to Urgent Care with complaints of sore throat/ nasal congestion/ headaches/ productive cough (yellow)/ body aches. Denies any known fevers.  Symptoms started Tuesday.  Taking nyquil and dayquil.  Positive Covid test at home. Wife sick with same symptoms. Exposed to sick contact at church.

## 2024-07-23 NOTE — ED Provider Notes (Signed)
 [facility]    CSN: [id number] Arrival date & time: 07/23/24  1129      History   Chief Complaint Chief Complaint  Patient presents with   Nasal Congestion   Headache   Sore Throat    HPI Troy Arnold is a 78 y.o. male.  Patient presents on day 5 of bodyaches, headache, congestion, sore throat, cough.  No fever, shortness of breath, vomiting, diarrhea.  He has been treating his symptoms with DayQuil and NyQuil.  He reports positive COVID test at home this morning but states the line was faint and he is unsure of the accuracy and would like to be retested.  The history is provided by the patient and medical records.    Past Medical History:  Diagnosis Date   Anemia    Cancer (HCC)    skin   Headache 2024   Hypercholesterolemia     Patient Active Problem List   Diagnosis Date Noted   Cough 12/27/2023   Decreased GFR 05/09/2023   Inguinal hernia 03/25/2023   Light headedness 08/10/2022   Foot pain 08/05/2022   Seasonal allergic rhinitis 08/05/2022   Sinusitis 08/05/2022   URI (upper respiratory infection) 09/20/2021   Influenza 01/05/2018   History of colonic polyps 01/07/2016   Encounter for screening for malignant neoplasm of colon 10/16/2015   Health care maintenance 04/14/2015   Chest pressure 10/23/2014   PVC (premature ventricular contraction) 10/23/2014   Basal cell carcinoma 04/08/2014   History of anemia 02/21/2013   Hypercholesterolemia 02/21/2013    Past Surgical History:  Procedure Laterality Date   cataract surgery     COLONOSCOPY WITH PROPOFOL  N/A 01/06/2016   Procedure: COLONOSCOPY WITH PROPOFOL ;  Surgeon: Gladis RAYMOND Mariner, MD;  Location: [facility] ENDOSCOPY;  Service: Endoscopy;  Laterality: N/A;   COLONOSCOPY WITH PROPOFOL  N/A 08/03/2023   Procedure: COLONOSCOPY WITH PROPOFOL ;  Surgeon: Maryruth Ole DASEN, MD;  Location: [facility] ENDOSCOPY;  Service: Endoscopy;  Laterality: N/A;   EYE SURGERY     HERNIA REPAIR  11/30/1981   HERNIA  REPAIR     INGUINAL HERNIA REPAIR Right 06/25/2015   Procedure: HERNIA REPAIR INGUINAL ADULT;  Surgeon: Larinda Unknown Sharps, MD;  Location: [facility] ORS;  Service: General;  Laterality: Right;   plate in rt forearm Right    Rod placed Rt leg Right    SKIN CANCER EXCISION  02/28/2014   BCCA-shoulder   XI ROBOTIC ASSISTED INGUINAL HERNIA REPAIR WITH MESH Left 04/12/2023   Procedure: XI ROBOTIC ASSISTED INGUINAL HERNIA REPAIR WITH MESH;  Surgeon: Rodolph Romano, MD;  Location: [facility] ORS;  Service: General;  Laterality: Left;       Home Medications    Prior to Admission medications   Medication Sig Start Date End Date Taking? Authorizing Provider  B Complex Vitamins (B COMPLEX 1 PO) Take 1 tablet by mouth daily.    [provider]  Cholecalciferol (VITAMIN D-3) 25 MCG (1000 UT) CAPS Take 1 capsule by mouth daily.    [provider]  Magnesium 400 MG TABS Take 1 tablet by mouth daily.    [provider]  rosuvastatin  (CRESTOR ) 5 MG tablet Take 1 tablet (5 mg total) by mouth daily. 02/02/24   Glendia Shad, MD    Family History Family History  Problem Relation Age of Onset   Diabetes Mother    Diabetes Brother    Diabetes Brother    Prostate cancer Neg Hx    Colon cancer Neg Hx  Social History Social History   Tobacco Use   Smoking status: Never   Smokeless tobacco: Never  Vaping Use   Vaping status: Never Used  Substance Use Topics   Alcohol use: Not Currently    Comment: rare   Drug use: No     Allergies   Patient has no known allergies.   Review of Systems Review of Systems  Constitutional:  Negative for chills and fever.  HENT:  Positive for congestion, rhinorrhea and sore throat. Negative for ear pain.   Respiratory:  Positive for cough. Negative for shortness of breath.   Cardiovascular:  Negative for chest pain and palpitations.  Gastrointestinal:  Negative for diarrhea and vomiting.  Neurological:  Positive for headaches.      Physical Exam Triage Vital Signs ED Triage Vitals [07/23/24 1136]  Encounter Vitals Group     BP 138/85     Girls Systolic BP Percentile      Girls Diastolic BP Percentile      Boys Systolic BP Percentile      Boys Diastolic BP Percentile      Pulse Rate 96     Resp 20     Temp 97.9 F (36.6 C)     Temp src      SpO2 99 %     Weight      Height      Head Circumference      Peak Flow      Pain Score 0     Pain Loc      Pain Education      Exclude from Growth Chart    No data found.  Updated Vital Signs BP 138/85   Pulse 96   Temp 97.9 F (36.6 C)   Resp 20   SpO2 99%   Visual Acuity Right Eye Distance:   Left Eye Distance:   Bilateral Distance:    Right Eye Near:   Left Eye Near:    Bilateral Near:     Physical Exam Constitutional:      General: He is not in acute distress. HENT:     Right Ear: Tympanic membrane normal.     Left Ear: Tympanic membrane normal.     Nose: Rhinorrhea present.     Mouth/Throat:     Mouth: Mucous membranes are moist.     Pharynx: Oropharynx is clear.  Cardiovascular:     Rate and Rhythm: Normal rate and regular rhythm.     Heart sounds: Normal heart sounds.  Pulmonary:     Effort: Pulmonary effort is normal. No respiratory distress.     Breath sounds: Normal breath sounds.  Neurological:     Mental Status: He is alert.      UC Treatments / Results  Labs (all labs ordered are listed, but only abnormal results are displayed) Labs Reviewed  POC SOFIA SARS ANTIGEN FIA - Abnormal; Notable for the following components:      Result Value   SARS Coronavirus 2 Ag Positive (*)    All other components within normal limits    EKG   Radiology No results found.  Procedures Procedures (including critical care time)  Medications Ordered in UC Medications - No data to display  Initial Impression / Assessment and Plan / UC Course  I have reviewed the triage vital signs and the nursing notes.  Pertinent labs &  imaging results that were available during my care of the patient were reviewed by me and considered in my medical  decision making (see chart for details).   COVID-19.  Treating with Paxlovid.  GFR 60.7 on 02/02/2024; no history of kidney or liver problems.  Discussed that Paxlovid is emergency authorized for treatment of COVID.  Discussed the side effects of Paxlovid, including dysgeusia, diarrhea, myalgias, hypertension.  Also discussed the possibility of rebound COVID.  Instructed her to pause her statin while on Paxlovid and then for 5 additional days.  Instructed patient to notify his PCP that he is COVID positive and taking Paxlovid.  ED precautions given.  Patient agrees to plan of care.    Final Clinical Impressions(s) / UC Diagnoses   Final diagnoses:  COVID-19     Discharge Instructions      Take the Paxlovid as directed.  Take Tylenol  as needed for fever or discomfort.  Rest and keep yourself hydrated.    Stop your rosuvastatin  while taking Paxlovid and for 5 additional days after the Paxlovid is finished.   Go to the emergency department if you have shortness of breath or other concerning symptoms.    Contact your primary care provider to let them know that you are COVID positive and taking Paxlovid.         ED Prescriptions   None    PDMP not reviewed this encounter.   Corlis Burnard DEL, NP 07/23/24 [phone]

## 2024-08-03 ENCOUNTER — Encounter: Payer: Self-pay | Admitting: Internal Medicine

## 2024-08-03 NOTE — Telephone Encounter (Signed)
 Labs are ordered

## 2024-08-04 ENCOUNTER — Ambulatory Visit: Admitting: Internal Medicine

## 2024-08-04 ENCOUNTER — Encounter: Payer: Self-pay | Admitting: Internal Medicine

## 2024-08-04 VITALS — BP 120/62 | HR 88 | Temp 97.4°F | Ht 70.0 in | Wt 160.4 lb

## 2024-08-04 DIAGNOSIS — Z860100 Personal history of colon polyps, unspecified: Secondary | ICD-10-CM | POA: Diagnosis not present

## 2024-08-04 DIAGNOSIS — R42 Dizziness and giddiness: Secondary | ICD-10-CM

## 2024-08-04 DIAGNOSIS — Z8616 Personal history of COVID-19: Secondary | ICD-10-CM

## 2024-08-04 DIAGNOSIS — R944 Abnormal results of kidney function studies: Secondary | ICD-10-CM | POA: Diagnosis not present

## 2024-08-04 DIAGNOSIS — E7800 Pure hypercholesterolemia, unspecified: Secondary | ICD-10-CM

## 2024-08-04 DIAGNOSIS — Z8349 Family history of other endocrine, nutritional and metabolic diseases: Secondary | ICD-10-CM

## 2024-08-04 DIAGNOSIS — Z125 Encounter for screening for malignant neoplasm of prostate: Secondary | ICD-10-CM | POA: Diagnosis not present

## 2024-08-04 LAB — LIPID PANEL
Cholesterol: 154 mg/dL (ref 0–200)
HDL: 40.4 mg/dL (ref >39.00)
LDL Cholesterol: 79 mg/dL (ref 0–99)
NonHDL: 113.32
Total CHOL/HDL Ratio: 4
Triglycerides: 171 mg/dL — ABNORMAL HIGH (ref 0.0–149.0)
VLDL: 34.2 mg/dL (ref 0.0–40.0)

## 2024-08-04 LAB — PSA, MEDICARE: PSA: 1.79 ng/mL (ref 0.10–4.00)

## 2024-08-04 LAB — HEPATIC FUNCTION PANEL
ALT: 20 U/L (ref 0–53)
AST: 19 U/L (ref 0–37)
Albumin: 4.3 g/dL (ref 3.5–5.2)
Alkaline Phosphatase: 42 U/L (ref 39–117)
Bilirubin, Direct: 0.1 mg/dL (ref 0.0–0.3)
Total Bilirubin: 0.5 mg/dL (ref 0.2–1.2)
Total Protein: 7 g/dL (ref 6.0–8.3)

## 2024-08-04 LAB — BASIC METABOLIC PANEL WITH GFR
BUN: 17 mg/dL (ref 6–23)
CO2: 30 meq/L (ref 19–32)
Calcium: 9.1 mg/dL (ref 8.4–10.5)
Chloride: 102 meq/L (ref 96–112)
Creatinine, Ser: 1.09 mg/dL (ref 0.40–1.50)
GFR: 64.5 mL/min (ref >60.00)
Glucose, Bld: 98 mg/dL (ref 70–99)
Potassium: 4.4 meq/L (ref 3.5–5.1)
Sodium: 140 meq/L (ref 135–145)

## 2024-08-04 LAB — TSH: TSH: 3.53 u[IU]/mL (ref 0.35–5.50)

## 2024-08-04 LAB — URINALYSIS, ROUTINE W REFLEX MICROSCOPIC
Bilirubin Urine: NEGATIVE
Hgb urine dipstick: NEGATIVE
Ketones, ur: NEGATIVE
Leukocytes,Ua: NEGATIVE
Nitrite: NEGATIVE
Specific Gravity, Urine: 1.01 (ref 1.000–1.030)
Total Protein, Urine: NEGATIVE
Urine Glucose: NEGATIVE
Urobilinogen, UA: 0.2 (ref 0.0–1.0)
pH: 6.5 (ref 5.0–8.0)

## 2024-08-04 LAB — VITAMIN B12: Vitamin B-12: 744 pg/mL (ref 211–911)

## 2024-08-04 NOTE — Progress Notes (Signed)
 Subjective:    [name], male    DOB: [birth year], 78 y.o.   MRN: [id number]  Patient here for  Chief Complaint  Patient presents with   Medical Management of Chronic Issues    6 mo f/u    HPI Here for a scheduled follow up - follow up regarding hypercholesterolemia and migraine headache. Has been on magnesium glycinate. Diagnosed with covid 06/2024. Seen UC 07/23/24 - prescribed paxlovid. Did not take. Doing better. Feels better. Getting back to his baseline. No cough or sob. He is walking 4 days per week. No chest pain. No nausea or vomiting reported. No abdominal pain or bowel change reported.    Past Medical History:  Diagnosis Date   Anemia    Cancer (HCC)    skin   Headache 2024   Hypercholesterolemia    Past Surgical History:  Procedure Laterality Date   cataract surgery     COLONOSCOPY WITH PROPOFOL  N/A 01/06/2016   Procedure: COLONOSCOPY WITH PROPOFOL ;  Surgeon: Gladis RAYMOND Mariner, MD;  Location: [facility] ENDOSCOPY;  Service: Endoscopy;  Laterality: N/A;   COLONOSCOPY WITH PROPOFOL  N/A 08/03/2023   Procedure: COLONOSCOPY WITH PROPOFOL ;  Surgeon: Maryruth Ole DASEN, MD;  Location: [facility] ENDOSCOPY;  Service: Endoscopy;  Laterality: N/A;   EYE SURGERY     HERNIA REPAIR  11/30/1981   HERNIA REPAIR     INGUINAL HERNIA REPAIR Right 06/25/2015   Procedure: HERNIA REPAIR INGUINAL ADULT;  Surgeon: Larinda Unknown Sharps, MD;  Location: [facility] ORS;  Service: General;  Laterality: Right;   plate in rt forearm Right    Rod placed Rt leg Right    SKIN CANCER EXCISION  02/28/2014   BCCA-shoulder   XI ROBOTIC ASSISTED INGUINAL HERNIA REPAIR WITH MESH Left 04/12/2023   Procedure: XI ROBOTIC ASSISTED INGUINAL HERNIA REPAIR WITH MESH;  Surgeon: Rodolph Romano, MD;  Location: [facility] ORS;  Service: General;  Laterality: Left;   Family History  Problem Relation Age of Onset   Diabetes Mother    Diabetes Brother    Diabetes Brother    Prostate cancer Neg Hx    Colon  cancer Neg Hx    Social History   Socioeconomic History   Marital status: Married    Spouse name: Not on file   Number of children: 2   Years of education: Not on file   Highest education level: Not on file  Occupational History   Not on file  Tobacco Use   Smoking status: Never   Smokeless tobacco: Never  Vaping Use   Vaping status: Never Used  Substance and Sexual Activity   Alcohol use: Not Currently    Comment: rare   Drug use: No   Sexual activity: Yes  Other Topics Concern   Not on file  Social History Narrative   Married   Social Drivers of Health   Financial Resource Strain: Low Risk  (10/20/2023)   Overall Financial Resource Strain (CARDIA)    Difficulty of Paying Living Expenses: Not hard at all  Food Insecurity: No Food Insecurity (10/20/2023)   Hunger Vital Sign    Worried About Running Out of Food in the Last Year: Never true    Ran Out of Food in the Last Year: Never true  Transportation Needs: No Transportation Needs (10/20/2023)   PRAPARE - Administrator, Civil Service (Medical): No    Lack of Transportation (Non-Medical): No  Physical Activity: Sufficiently Active (10/20/2023)   Exercise Vital Sign  Days of Exercise per Week: 3 days    Minutes of Exercise per Session: 60 min  Stress: No Stress Concern Present (10/20/2023)   [employer] of Occupational Health - Occupational Stress Questionnaire    Feeling of Stress : Not at all  Social Connections: Socially Integrated (10/20/2023)   Social Connection and Isolation Panel    Frequency of Communication with Friends and Family: More than three times a week    Frequency of Social Gatherings with Friends and Family: More than three times a week    Attends Religious Services: More than 4 times per year    Active Member of [employer] or Organizations: Yes    Attends Engineer, structural: More than 4 times per year    Marital Status: Married     Review of Systems   Constitutional:  Negative for appetite change and unexpected weight change.  HENT:  Negative for congestion and sinus pressure.   Respiratory:  Negative for cough, chest tightness and shortness of breath.   Cardiovascular:  Negative for chest pain, palpitations and leg swelling.  Gastrointestinal:  Negative for abdominal pain, diarrhea, nausea and vomiting.  Genitourinary:  Negative for difficulty urinating and dysuria.  Musculoskeletal:  Negative for joint swelling and myalgias.  Skin:  Negative for color change and rash.  Neurological:  Negative for headaches.       No increased dizziness.   Psychiatric/Behavioral:  Negative for agitation and dysphoric mood.        Objective:     BP 120/62   Pulse 88   Temp (!) 97.4 F (36.3 C) (Oral)   Ht 5' 10 (1.778 m)   Wt 160 lb 6.4 oz (72.8 kg)   SpO2 97%   BMI 23.02 kg/m  Wt Readings from Last 3 Encounters:  08/04/24 160 lb 6.4 oz (72.8 kg)  02/02/24 165 lb 6.4 oz (75 kg)  01/19/24 167 lb 2.1 oz (75.8 kg)    Physical Exam Vitals reviewed.  Constitutional:      General: He is not in acute distress.    Appearance: Normal appearance. He is well-developed.  HENT:     Head: Normocephalic and atraumatic.     Right Ear: External ear normal.     Left Ear: External ear normal.     Mouth/Throat:     Pharynx: No oropharyngeal exudate or posterior oropharyngeal erythema.  Eyes:     General: No scleral icterus.       Right eye: No discharge.        Left eye: No discharge.     Conjunctiva/sclera: Conjunctivae normal.  Cardiovascular:     Rate and Rhythm: Normal rate and regular rhythm.  Pulmonary:     Effort: Pulmonary effort is normal. No respiratory distress.     Breath sounds: Normal breath sounds.  Abdominal:     General: Bowel sounds are normal.     Palpations: Abdomen is soft.     Tenderness: There is no abdominal tenderness.  Musculoskeletal:        General: No swelling or tenderness.     Cervical back: Neck supple. No  tenderness.  Lymphadenopathy:     Cervical: No cervical adenopathy.  Skin:    Findings: No erythema or rash.  Neurological:     Mental Status: He is alert.  Psychiatric:        Mood and Affect: Mood normal.        Behavior: Behavior normal.         Outpatient  Encounter Medications as of 08/04/2024  Medication Sig   B Complex Vitamins (B COMPLEX 1 PO) Take 1 tablet by mouth daily.   Cholecalciferol (VITAMIN D-3) 25 MCG (1000 UT) CAPS Take 1 capsule by mouth daily.   Magnesium 400 MG TABS Take 1 tablet by mouth daily.   rosuvastatin  (CRESTOR ) 5 MG tablet Take 1 tablet (5 mg total) by mouth daily.   No facility-administered encounter medications on file as of 08/04/2024.     Lab Results  Component Value Date   WBC 5.4 02/02/2024   HGB 14.4 02/02/2024   HCT 43.3 02/02/2024   PLT 230.0 02/02/2024   GLUCOSE 98 08/04/2024   CHOL 154 08/04/2024   TRIG 171.0 (H) 08/04/2024   HDL 40.40 08/04/2024   LDLCALC 79 08/04/2024   ALT 20 08/04/2024   AST 19 08/04/2024   NA 140 08/04/2024   K 4.4 08/04/2024   CL 102 08/04/2024   CREATININE 1.09 08/04/2024   BUN 17 08/04/2024   CO2 30 08/04/2024   TSH 3.53 08/04/2024   PSA 1.79 08/04/2024       Assessment & Plan:  Prostate cancer screening -     PSA, Medicare  Hypercholesterolemia Assessment & Plan: Continues on crestor . Low cholesterol diet and exercise. Check lipid panel.   Orders: -     Hepatic function panel -     Basic metabolic panel with GFR -     Lipid panel -     TSH  Decreased GFR -     Urinalysis, Routine w reflex microscopic  Family history of B12 deficiency -     Vitamin B12  History of colonic polyps Assessment & Plan: Colonoscopy 08/2023 - diverticulosis, internal hemorrhoids.  No f/u colonoscopy.    Light headedness Assessment & Plan: MRI brain 08/17/22 - no acute abnormality.  Saw ENT and ophthalmology. Saw Dr Maree 10/27/22.   Symptoms felt to be c/w migraine. Recommended magnesium and continuing  crestor .  Taking magnesium regularly. Overall improved. Follow.    History of COVID-19 Assessment & Plan: Recent covid. Doing better. Feeling back to near baseline.       Allena Hamilton, MD

## 2024-08-05 ENCOUNTER — Encounter: Payer: Self-pay | Admitting: Internal Medicine

## 2024-08-05 ENCOUNTER — Ambulatory Visit: Payer: Self-pay | Admitting: Internal Medicine

## 2024-08-05 DIAGNOSIS — Z8616 Personal history of COVID-19: Secondary | ICD-10-CM | POA: Insufficient documentation

## 2024-08-05 NOTE — Assessment & Plan Note (Signed)
 Colonoscopy 08/2023 - diverticulosis, internal hemorrhoids.  No f/u colonoscopy.

## 2024-08-05 NOTE — Assessment & Plan Note (Signed)
 Recent covid. Doing better. Feeling back to near baseline.

## 2024-08-05 NOTE — Assessment & Plan Note (Signed)
 Continues on crestor . Low cholesterol diet and exercise. Check lipid panel.

## 2024-08-05 NOTE — Assessment & Plan Note (Signed)
 MRI brain 08/17/22 - no acute abnormality.  Saw ENT and ophthalmology. Saw Dr Maree 10/27/22.   Symptoms felt to be c/w migraine. Recommended magnesium and continuing crestor .  Taking magnesium regularly. Overall improved. Follow.

## 2024-09-01 ENCOUNTER — Ambulatory Visit (INDEPENDENT_AMBULATORY_CARE_PROVIDER_SITE_OTHER)

## 2024-09-01 DIAGNOSIS — Z23 Encounter for immunization: Secondary | ICD-10-CM | POA: Diagnosis not present

## 2024-09-01 NOTE — Progress Notes (Signed)
 Pt received High dose flu injection in Left  deltoid  muscle. Pt tolerated it well with no complaints or concerns.

## 2024-09-06 ENCOUNTER — Ambulatory Visit: Admitting: Podiatry

## 2024-09-06 VITALS — Ht 70.0 in | Wt 160.4 lb

## 2024-09-06 DIAGNOSIS — M7741 Metatarsalgia, right foot: Secondary | ICD-10-CM | POA: Diagnosis not present

## 2024-09-06 DIAGNOSIS — M7742 Metatarsalgia, left foot: Secondary | ICD-10-CM | POA: Diagnosis not present

## 2024-09-06 DIAGNOSIS — L84 Corns and callosities: Secondary | ICD-10-CM

## 2024-09-06 NOTE — Progress Notes (Signed)
  Subjective:  [name], male    DOB: [birth year],  MRN: [id number]  Chief Complaint  Patient presents with   Callouses    Rm 2 Patient is here for bilateral calluses on the plantar aspect of the feet (the lateral side).    78 y.o. male presents with the above complaint. History confirmed with patient.  Calluses have returned again and are painful.  Intermittent treatment has been helpful for him.  Objective:  Physical Exam: warm, good capillary refill, no trophic changes or ulcerative lesions, normal DP and PT pulses, normal sensory exam, and painful hyperkeratosis submetatarsal 5 bilateral.  Prominence of the metatarsal head here  Assessment:   1. Callus of foot   2. Metatarsalgia of both feet       Plan:  Patient was evaluated and treated and all questions answered.  We discussed etiology and treatment options of hyperkeratotic lesions in this particular area.    I debrided the lesions sharply with a scalpel as a courtesy today and applied salinocaine he will leave it on for 24 hours.  Still would like to avoid surgery at this point   Return in about 4 months (around 01/07/2025) for painful calluses / metatarsalgia.

## 2024-10-20 ENCOUNTER — Ambulatory Visit: Payer: Medicare Other | Admitting: *Deleted

## 2024-10-20 VITALS — Ht 70.0 in | Wt 156.0 lb

## 2024-10-20 DIAGNOSIS — Z0000 Encounter for general adult medical examination without abnormal findings: Secondary | ICD-10-CM

## 2024-10-20 NOTE — Progress Notes (Signed)
 Chief Complaint  Patient presents with   Medicare Wellness     Subjective:   Troy Arnold is a 78 y.o. male who presents for a Medicare Annual Wellness Visit.  Allergies (verified) Patient has no known allergies.   History: Past Medical History:  Diagnosis Date   Anemia    Cancer (HCC)    skin   Headache 2024   Hypercholesterolemia    Past Surgical History:  Procedure Laterality Date   cataract surgery     COLONOSCOPY WITH PROPOFOL  N/A 01/06/2016   Procedure: COLONOSCOPY WITH PROPOFOL ;  Surgeon: Gladis RAYMOND Mariner, MD;  Location: [facility] ENDOSCOPY;  Service: Endoscopy;  Laterality: N/A;   COLONOSCOPY WITH PROPOFOL  N/A 08/03/2023   Procedure: COLONOSCOPY WITH PROPOFOL ;  Surgeon: Maryruth Ole DASEN, MD;  Location: [facility] ENDOSCOPY;  Service: Endoscopy;  Laterality: N/A;   EYE SURGERY     HERNIA REPAIR  11/30/1981   HERNIA REPAIR     INGUINAL HERNIA REPAIR Right 06/25/2015   Procedure: HERNIA REPAIR INGUINAL ADULT;  Surgeon: Larinda Unknown Sharps, MD;  Location: [facility] ORS;  Service: General;  Laterality: Right;   plate in rt forearm Right    Rod placed Rt leg Right    SKIN CANCER EXCISION  02/28/2014   BCCA-shoulder   XI ROBOTIC ASSISTED INGUINAL HERNIA REPAIR WITH MESH Left 04/12/2023   Procedure: XI ROBOTIC ASSISTED INGUINAL HERNIA REPAIR WITH MESH;  Surgeon: Rodolph Romano, MD;  Location: [facility] ORS;  Service: General;  Laterality: Left;   Family History  Problem Relation Age of Onset   Diabetes Mother    Diabetes Brother    Diabetes Brother    Prostate cancer Neg Hx    Colon cancer Neg Hx    Social History   Occupational History   Not on file  Tobacco Use   Smoking status: Never   Smokeless tobacco: Never  Vaping Use   Vaping status: Never Used  Substance and Sexual Activity   Alcohol use: Not Currently    Comment: rare   Drug use: No   Sexual activity: Yes   Tobacco Counseling Counseling given: Not Answered  SDOH Screenings   Food Insecurity:  No Food Insecurity (10/20/2024)  Housing: Low Risk  (10/20/2024)  Transportation Needs: No Transportation Needs (10/20/2024)  Utilities: Not At Risk (10/20/2024)  Alcohol Screen: Low Risk  (10/20/2024)  Depression (PHQ2-9): Low Risk  (10/20/2024)  Financial Resource Strain: Low Risk  (10/20/2024)  Physical Activity: Sufficiently Active (10/20/2024)  Social Connections: Socially Integrated (10/20/2024)  Stress: No Stress Concern Present (10/20/2024)  Tobacco Use: Low Risk  (10/20/2024)  Health Literacy: Adequate Health Literacy (10/20/2024)   See flowsheets for full screening details  Depression Screen PHQ 2 & 9 Depression Scale- Over the past 2 weeks, how often have you been bothered by any of the following problems? Little interest or pleasure in doing things: 0 Feeling down, depressed, or hopeless (PHQ Adolescent also includes...irritable): 0 PHQ-2 Total Score: 0 Trouble falling or staying asleep, or sleeping too much: 0 Feeling tired or having little energy: 0 Poor appetite or overeating (PHQ Adolescent also includes...weight loss): 0 Feeling bad about yourself - or that you are a failure or have let yourself or your family down: 0 Trouble concentrating on things, such as reading the newspaper or watching television (PHQ Adolescent also includes...like school work): 0 Moving or speaking so slowly that other people could have noticed. Or the opposite - being so fidgety or restless that you have been moving around a lot  more than usual: 0 Thoughts that you would be better off dead, or of hurting yourself in some way: 0 PHQ-9 Total Score: 0 If you checked off any problems, how difficult have these problems made it for you to do your work, take care of things at home, or get along with other people?: Not difficult at all     Goals Addressed             This Visit's Progress    Patient Stated       Wants to stay active        Visit info / Clinical Intake: Medicare Wellness  Visit Type:: Subsequent Annual Wellness Visit Persons participating in visit:: patient Medicare Wellness Visit Mode:: Telephone If telephone:: video declined Because this visit was a virtual/telehealth visit:: pt reported vitals If Telephone or Video please confirm:: I connected with the patient using audio enabled telemedicine application and verified that I am speaking with the correct person using two identifiers; I discussed the limitations of evaluation and management by telemedicine; The patient expressed understanding and agreed to proceed Patient Location:: Home Provider Location:: Office/Home Information given by:: patient Interpreter Needed?: No Pre-visit prep was completed: yes AWV questionnaire completed by patient prior to visit?: yes Date:: 10/13/24 Living arrangements:: lives with spouse/significant other Patient's Overall Health Status Rating: good Typical amount of pain: none Does pain affect daily life?: no Are you currently prescribed opioids?: no  Dietary Habits and Nutritional Risks How many meals a day?: 2 Eats fruit and vegetables daily?: yes Most meals are obtained by: preparing own meals In the last 2 weeks, have you had any of the following?: none Diabetic:: no  Functional Status Activities of Daily Living (to include ambulation/medication): (Patient-Rptd) Independent Ambulation: (Patient-Rptd) Independent Medication Administration: Independent Home Management: (Patient-Rptd) Independent Manage your own finances?: yes Primary transportation is: driving Concerns about vision?: no *vision screening is required for WTM* Concerns about hearing?: no  Fall Screening Falls in the past year?: (Patient-Rptd) 0 Number of falls in past year: 0 Was there an injury with Fall?: 0 Fall Risk Category Calculator: 0 Patient Fall Risk Level: Low Fall Risk  Fall Risk Patient at Risk for Falls Due to: No Fall Risks Fall risk Follow up: Falls evaluation completed;  Falls prevention discussed  Home and Transportation Safety: All rugs have non-skid backing?: yes All stairs or steps have railings?: N/A, no stairs Grab bars in the bathtub or shower?: yes Have non-skid surface in bathtub or shower?: yes Good home lighting?: yes Regular seat belt use?: yes Hospital stays in the last year:: no  Cognitive Assessment Difficulty concentrating, remembering, or making decisions? : no Will 6CIT or Mini Cog be Completed: yes What year is it?: 0 points What month is it?: 0 points Give patient an address phrase to remember (5 components): [address] [place] what time is it?: 0 points Count backwards from 20 to 1: 0 points Say the months of the year in reverse: 0 points Repeat the address phrase from earlier: 2 points 6 CIT Score: 2 points  Advance Directives (For Healthcare) Does Patient Have a Medical Advance Directive?: Yes Does patient want to make changes to medical advance directive?: No - Patient declined Type of Advance Directive: Healthcare Power of Conception; Living will Copy of Healthcare Power of Attorney in Chart?: No - copy requested Copy of Living Will in Chart?: No - copy requested  Reviewed/Updated  Reviewed/Updated: Reviewed All (Medical, Surgical, Family, Medications, Allergies, Care Teams, Patient Goals)  Objective:    Today's Vitals   10/20/24 0811  Weight: 156 lb (70.8 kg)  Height: 5' 10 (1.778 m)   Body mass index is 22.38 kg/m.  Current Medications (verified) Outpatient Encounter Medications as of 10/20/2024  Medication Sig   B Complex Vitamins (B COMPLEX 1 PO) Take 1 tablet by mouth daily.   Cholecalciferol (VITAMIN D-3) 25 MCG (1000 UT) CAPS Take 1 capsule by mouth daily.   Magnesium 400 MG TABS Take 1 tablet by mouth daily.   rosuvastatin  (CRESTOR ) 5 MG tablet Take 1 tablet (5 mg total) by mouth daily.   No facility-administered encounter medications on file as of 10/20/2024.    Hearing/Vision screen Hearing Screening - Comments:: No issues Vision Screening - Comments:: Readers, Dr. Carolee , up to date Immunizations and Health Maintenance Health Maintenance  Topic Date Due   DTaP/Tdap/Td (3 - Td or Tdap) 07/30/2024   COVID-19 Vaccine (4 - 2025-26 season) 07/31/2024   Zoster Vaccines- Shingrix (2 of 2) 11/03/2024 (Originally 03/05/2020)   Medicare Annual Wellness (AWV)  10/20/2025   Pneumococcal Vaccine: 50+ Years  Completed   Influenza Vaccine  Completed   Hepatitis C Screening  Completed   Meningococcal B Vaccine  Aged Out   Hepatitis B Vaccines 19-59 Average Risk  Discontinued   Colonoscopy  Discontinued        Assessment/Plan:  This is a routine wellness examination for [place].  Patient Care Team: Glendia Shad, MD as PCP - General (Internal Medicine) Maryruth, Ole DASEN, MD as Consulting Physician (Gastroenterology) Paich, Kaitlin, PA-C (Physician Assistant) Silva Juliene SAUNDERS, DPM as Consulting Physician (Podiatry)  I have personally reviewed and noted the following in the patient's chart:   Medical and social history Use of alcohol, tobacco or illicit drugs  Current medications and supplements including opioid prescriptions. Functional ability and status Nutritional status Physical activity Advanced directives List of other physicians Hospitalizations, surgeries, and ER visits in previous 12 months Vitals Screenings to include cognitive, depression, and falls Referrals and appointments  No orders of the defined types were placed in this encounter.  In addition, I have reviewed and discussed with patient certain preventive protocols, quality metrics, and best practice recommendations. A written personalized care plan for preventive services as well as general preventive health recommendations were provided to patient.   Angeline Fredericks, LPN   [id number]   Return in 1 year (on 10/20/2025).  After Visit Summary: (MyChart) Due to this being  a telephonic visit, the after visit summary with patients personalized plan was offered to patient via MyChart   Nurse Notes: Discussed the need to update tetanus, covid and shingles vaccines.

## 2024-10-20 NOTE — Patient Instructions (Signed)
 Troy Arnold,  Thank you for taking the time for your Medicare Wellness Visit. I appreciate your continued commitment to your health goals. Please review the care plan we discussed, and feel free to reach out if I can assist you further.  Please note that Annual Wellness Visits do not include a physical exam. Some assessments may be limited, especially if the visit was conducted virtually. If needed, we may recommend an in-person follow-up with your provider.  Ongoing Care Seeing your primary care provider every 3 to 6 months helps us  monitor your health and provide consistent, personalized care.  Remember to update your tetanus, covid and shingles vaccines.  Referrals If a referral was made during today's visit and you haven't received any updates within two weeks, please contact the referred provider directly to check on the status.  Recommended Screenings:  Health Maintenance  Topic Date Due   DTaP/Tdap/Td vaccine (3 - Td or Tdap) 07/30/2024   COVID-19 Vaccine (4 - 2025-26 season) 07/31/2024   Zoster (Shingles) Vaccine (2 of 2) 11/03/2024*   Medicare Annual Wellness Visit  10/20/2025   Pneumococcal Vaccine for age over 82  Completed   Flu Shot  Completed   Hepatitis C Screening  Completed   Meningitis B Vaccine  Aged Out   Hepatitis B Vaccine  Discontinued   Colon Cancer Screening  Discontinued  *Topic was postponed. The date shown is not the original due date.       10/13/2024    9:22 AM  Advanced Directives  Does Patient Have a Medical Advance Directive? Yes  Type of Estate Agent of [place];Living will  Does patient want to make changes to medical advance directive? No - Patient declined  Copy of Healthcare Power of Attorney in Chart? No - copy requested    Vision: Annual vision screenings are recommended for early detection of glaucoma, cataracts, and diabetic retinopathy. These exams can also reveal signs of chronic conditions such as diabetes and  high blood pressure.  Dental: Annual dental screenings help detect early signs of oral cancer, gum disease, and other conditions linked to overall health, including heart disease and diabetes.  Please see the attached documents for additional preventive care recommendations.

## 2025-01-03 ENCOUNTER — Ambulatory Visit: Admitting: Podiatry

## 2025-01-03 VITALS — Ht 70.0 in | Wt 156.0 lb

## 2025-01-03 DIAGNOSIS — M7741 Metatarsalgia, right foot: Secondary | ICD-10-CM | POA: Diagnosis not present

## 2025-01-03 DIAGNOSIS — M7742 Metatarsalgia, left foot: Secondary | ICD-10-CM

## 2025-01-03 DIAGNOSIS — L84 Corns and callosities: Secondary | ICD-10-CM

## 2025-01-03 NOTE — Progress Notes (Signed)
"  °  Subjective:  [name], male    DOB: 1946-03-27,  MRN: [id number]  Chief Complaint  Patient presents with   Callouses    RM 1 Patient is to f/u on bilateral calluses of the plantar aspect of the feet ( lateral side).    79 y.o. male presents with the above complaint. History confirmed with patient.  Calluses have returned again and are painful.  Intermittent treatment has been helpful for him.  Objective:  Physical Exam: warm, good capillary refill, no trophic changes or ulcerative lesions, normal DP and PT pulses, normal sensory exam, and painful hyperkeratosis submetatarsal 5 bilateral.  Prominence of the metatarsal head here  Assessment:   1. Metatarsalgia of both feet   2. Callus of foot       Plan:  Patient was evaluated and treated and all questions answered.  We discussed etiology and treatment options of hyperkeratotic lesions in this particular area.    I debrided the lesions sharply with a scalpel and applied salinocaine he will leave it on for 24 hours.  Still would like to avoid surgery at this point.  Return in 4 months for retreatment.   Return in about 4 months (around 05/03/2025) for calluses.   "

## 2025-02-02 ENCOUNTER — Encounter: Admitting: Internal Medicine

## 2025-05-16 ENCOUNTER — Ambulatory Visit: Admitting: Podiatry

## 2025-10-29 ENCOUNTER — Ambulatory Visit
